# Patient Record
Sex: Female | Born: 1965 | Race: White | Hispanic: No | Marital: Single | Smoking: Never smoker
Health system: Southern US, Community
[De-identification: ages and names within clinical notes are randomized; demographics above are authoritative.]

## PROBLEM LIST (undated history)

## (undated) DIAGNOSIS — F419 Anxiety disorder, unspecified: Secondary | ICD-10-CM

---

## 1997-08-17 ENCOUNTER — Other Ambulatory Visit: Admission: RE | Admit: 1997-08-17 | Discharge: 1997-08-17 | Payer: Self-pay | Admitting: Dermatology

## 1998-03-22 ENCOUNTER — Other Ambulatory Visit: Admission: RE | Admit: 1998-03-22 | Discharge: 1998-03-22 | Payer: Self-pay | Admitting: Obstetrics and Gynecology

## 1998-07-19 ENCOUNTER — Inpatient Hospital Stay (HOSPITAL_COMMUNITY): Admission: AD | Admit: 1998-07-19 | Discharge: 1998-07-19 | Payer: Self-pay | Admitting: Obstetrics and Gynecology

## 1998-08-21 ENCOUNTER — Inpatient Hospital Stay (HOSPITAL_COMMUNITY): Admission: AD | Admit: 1998-08-21 | Discharge: 1998-08-25 | Payer: Self-pay | Admitting: Obstetrics and Gynecology

## 1998-08-22 ENCOUNTER — Encounter: Payer: Self-pay | Admitting: Obstetrics and Gynecology

## 1998-10-09 ENCOUNTER — Other Ambulatory Visit: Admission: RE | Admit: 1998-10-09 | Discharge: 1998-10-09 | Payer: Self-pay | Admitting: Gynecology

## 1999-06-27 ENCOUNTER — Other Ambulatory Visit: Admission: RE | Admit: 1999-06-27 | Discharge: 1999-06-27 | Payer: Self-pay | Admitting: Gynecology

## 2003-03-12 ENCOUNTER — Emergency Department (HOSPITAL_COMMUNITY): Admission: EM | Admit: 2003-03-12 | Discharge: 2003-03-12 | Payer: Self-pay | Admitting: Emergency Medicine

## 2015-11-18 ENCOUNTER — Encounter (HOSPITAL_COMMUNITY): Payer: Self-pay | Admitting: Emergency Medicine

## 2015-11-18 ENCOUNTER — Ambulatory Visit (HOSPITAL_COMMUNITY)
Admission: EM | Admit: 2015-11-18 | Discharge: 2015-11-18 | Disposition: A | Discharge status: Home / Self Care | Payer: Managed Care, Other (non HMO) | Attending: Internal Medicine | Admitting: Internal Medicine

## 2015-11-18 DIAGNOSIS — J069 Acute upper respiratory infection, unspecified: Secondary | ICD-10-CM

## 2015-11-18 DIAGNOSIS — H6593 Unspecified nonsuppurative otitis media, bilateral: Secondary | ICD-10-CM | POA: Diagnosis not present

## 2015-11-18 DIAGNOSIS — R0982 Postnasal drip: Secondary | ICD-10-CM

## 2015-11-18 MED ORDER — PHENYLEPHRINE-CHLORPHEN-DM 10-4-12.5 MG/5ML PO LIQD
5.0000 mL | ORAL | 0 refills | Status: DC | PRN
Start: 2015-11-18 — End: 2018-06-03

## 2015-11-18 MED ORDER — AZITHROMYCIN 250 MG PO TABS: ORAL_TABLET | ORAL | 0 refills | Status: DC

## 2015-11-18 NOTE — ED Provider Notes (Signed)
CSN: [id number]     Arrival date & time 11/18/15  1551 History   First MD Initiated Contact with Patient 11/18/15 1748     Chief Complaint  Patient presents with  . URI   (Consider location/radiation/quality/duration/timing/severity/associated sxs/prior Treatment) 50-year-old female states she has had a cold since September 19. Symptoms include cough, PND, nasal drainage, sore throat and ears feeling like they are full of fluid. States her temperature home was 99.9.      History reviewed. No pertinent past medical history. History reviewed. No pertinent surgical history. History reviewed. No pertinent family history. Social History  Substance Use Topics  . Smoking status: Never Smoker  . Smokeless tobacco: Never Used  . Alcohol use No   OB History    No data available     Review of Systems  Constitutional: Positive for activity change and fever. Negative for appetite change, chills and fatigue.  HENT: Positive for congestion, postnasal drip, rhinorrhea and sinus pressure. Negative for facial swelling.   Eyes: Negative.   Respiratory: Positive for cough. Negative for shortness of breath.   Cardiovascular: Negative.   Gastrointestinal: Negative.   Musculoskeletal: Negative for neck pain and neck stiffness.  Skin: Negative for pallor and rash.  Neurological: Negative.     Allergies  Penicillins  Home Medications   Prior to Admission medications   Medication Sig Start Date End Date Taking? Authorizing Provider  azithromycin (ZITHROMAX) 250 MG tablet Take 2 tabs on day 1, then one tablet po once daily on days 2-5. 11/18/15   Hayden Rasmussenavid Bulah Lurie, NP  Phenylephrine-Chlorphen-DM 11-29-10.5 MG/5ML LIQD Take 5 mLs by mouth every 4 (four) hours as needed. 11/18/15   Hayden Rasmussenavid Arayla Kruschke, NP   Meds Ordered and Administered this Visit  Medications - No data to display  BP 155/92 (BP Location: Left Arm)   Pulse 84   Temp 99.1 F (37.3 C) (Oral)   Resp 16   LMP 10/31/2015   SpO2 99%  No data  found.   Physical Exam  Constitutional: She is oriented to person, place, and time. She appears well-developed and well-nourished. No distress.  HENT:  Head: Normocephalic and atraumatic.  Mouth/Throat: No oropharyngeal exudate.  Bilateral TMs are erythematous and bulging.  Oropharynx with minor erythema and cobblestoning. No swelling or exudates. Airway widely patent  Eyes: EOM are normal.  Neck: Normal range of motion. Neck supple.  Cardiovascular: Normal rate, regular rhythm and normal heart sounds.   Pulmonary/Chest: Effort normal and breath sounds normal. No respiratory distress. She has no wheezes. She has no rales.  Musculoskeletal: Normal range of motion. She exhibits no edema.  Lymphadenopathy:    She has no cervical adenopathy.  Neurological: She is alert and oriented to person, place, and time.  Skin: Skin is warm and dry. No rash noted.  Psychiatric: She has a normal mood and affect.  Nursing note and vitals reviewed.   Urgent Care Course   Clinical Course    Procedures (including critical care time)  Labs Review Labs Reviewed - No data to display  Imaging Review No results found.   Visual Acuity Review  Right Eye Distance:   Left Eye Distance:   Bilateral Distance:    Right Eye Near:   Left Eye Near:    Bilateral Near:         MDM   1. URI (upper respiratory infection)   2. Otitis media with effusion, bilateral   3. PND (post-nasal drip)    Drink plenty of fluids and  stay well-hydrated. May take ibuprofen 600 mg every 6-8 hours as needed for discomfort. Medications prescribed may cause drowsiness. Meds ordered this encounter  Medications  . Phenylephrine-Chlorphen-DM 11-29-10.5 MG/5ML LIQD    Sig: Take 5 mLs by mouth every 4 (four) hours as needed.    Dispense:  120 mL    Refill:  0    Order Specific Question:   Supervising Provider    Answer:   Eustace Moore [[id number]]  . azithromycin (ZITHROMAX) 250 MG tablet    Sig: Take 2 tabs on day  1, then one tablet po once daily on days 2-5.    Dispense:  6 tablet    Refill:  0    Order Specific Question:   Supervising Provider    Answer:   Eustace Moore [[id number]]       Hayden Rasmussen, NP 11/18/15 1814    Hayden Rasmussen, NP 11/18/15 1815

## 2015-11-18 NOTE — Discharge Instructions (Signed)
Drink plenty of fluids and stay well-hydrated. May take ibuprofen 600 mg every 6-8 hours as needed for discomfort. Medications prescribed may cause drowsiness.

## 2015-11-18 NOTE — ED Triage Notes (Signed)
Pt is here for cold sx onset 09/19 associated w/left ear pain, cough, fevers, PND, nasal congestion/drainage  A&O x4... NAD

## 2016-10-14 ENCOUNTER — Ambulatory Visit: Payer: Managed Care, Other (non HMO) | Admitting: Family Medicine

## 2016-10-22 ENCOUNTER — Ambulatory Visit: Payer: Managed Care, Other (non HMO) | Admitting: Family Medicine

## 2017-10-21 ENCOUNTER — Encounter (HOSPITAL_COMMUNITY): Payer: Self-pay

## 2017-10-21 ENCOUNTER — Ambulatory Visit (HOSPITAL_COMMUNITY)
Admission: EM | Admit: 2017-10-21 | Discharge: 2017-10-21 | Disposition: A | Discharge status: Home / Self Care | Payer: BLUE CROSS/BLUE SHIELD | Attending: Family Medicine | Admitting: Family Medicine

## 2017-10-21 DIAGNOSIS — G4700 Insomnia, unspecified: Secondary | ICD-10-CM

## 2017-10-21 DIAGNOSIS — F418 Other specified anxiety disorders: Secondary | ICD-10-CM

## 2017-10-21 MED ORDER — LORAZEPAM 1 MG PO TABS: 1.0000 mg | ORAL_TABLET | Freq: Three times a day (TID) | ORAL | 0 refills | Status: DC | PRN

## 2017-10-21 NOTE — ED Triage Notes (Signed)
Pt complains of having anxiety and not being able to sleep the past few days because of family stress.

## 2017-10-21 NOTE — ED Provider Notes (Signed)
[facility]   [id number] 10/21/17 Arrival Time: 1512  ASSESSMENT & PLAN:  1. Situational anxiety   2. Insomnia, unspecified type     Meds ordered this encounter  Medications  . LORazepam (ATIVAN) 1 MG tablet    Sig: Take 1 tablet (1 mg total) by mouth every 8 (eight) hours as needed for anxiety.    Dispense:  20 tablet    Refill:  0   Med sedation precautions. Temporary, as needed, use explained. To schedule f/u with PCP asap.  Reviewed expectations re: course of current medical issues. Questions answered. Outlined signs and symptoms indicating need for more acute intervention. Patient verbalized understanding. After Visit Summary given.   SUBJECTIVE:  Debra Russell is a 52 y.o. female who presents with complaint of:  Anxiety: Patient complains of sleep disturbance and and generalized anxiety/stress. She has the following symptoms: feelings of losing control, insomnia, racing thoughts. Onset of symptoms was approximately a few days ago, unchanged since that time. She denies current suicidal and homicidal ideation. Family history significant for no psychiatric illness.Possible organic causes contributing are: none. Risk factors: previous anxiety Previous treatment includes Xanax. No specific aggravating or alleviating factors reported. OTC Melatonin without help. No drug use reported. Able to function at work "but tired."  ROS: As per HPI.   OBJECTIVE:  Vitals:   10/21/17 1551  BP: (!) 150/99  Pulse: 74  Resp: 20  Temp: 97.7 F (36.5 C)  TempSrc: Oral  SpO2: 97%    General appearance: alert; no distress Eyes: PERRLA; EOMI; conjunctiva normal HENT: normocephalic; atraumatic Neck: supple Lungs: clear to auscultation bilaterally Heart: regular rate and rhythm Ab Extremities: edema; symmetrical with no gross deformities Skin: warm and dry Neurologic: normal gait Psychological: alert and cooperative; normal mood and affect   Allergies    Allergen Reactions  . Penicillins      Social History   Socioeconomic History  . Marital status: Single    Spouse name: Not on file  . Number of children: Not on file  . Years of education: Not on file  . Highest education level: Not on file  Occupational History  . Not on file  Social Needs  . Financial resource strain: Not on file  . Food insecurity:    Worry: Not on file    Inability: Not on file  . Transportation needs:    Medical: Not on file    Non-medical: Not on file  Tobacco Use  . Smoking status: Never Smoker  . Smokeless tobacco: Never Used  Substance and Sexual Activity  . Alcohol use: No  . Drug use: Not on file  . Sexual activity: Not on file  Lifestyle  . Physical activity:    Days per week: Not on file    Minutes per session: Not on file  . Stress: Not on file  Relationships  . Social connections:    Talks on phone: Not on file    Gets together: Not on file    Attends religious service: Not on file    Active member of club or organization: Not on file    Attends meetings of clubs or organizations: Not on file    Relationship status: Not on file  . Intimate partner violence:    Fear of current or ex partner: Not on file    Emotionally abused: Not on file    Physically abused: Not on file    Forced sexual activity: Not on file  Other Topics Concern  .  Not on file  Social History Narrative  . Not on file   History reviewed. No pertinent family history. History reviewed. No pertinent surgical history.    Mardella LaymanHagler, Judene Logue, MD 10/25/17 [phone]

## 2017-10-21 NOTE — Discharge Instructions (Signed)
Be aware, anxiety medications may cause drowsiness. Please do not drive, operate heavy machinery or make important decisions while on this medication, it can cloud your judgement.

## 2018-06-01 ENCOUNTER — Emergency Department (HOSPITAL_COMMUNITY)
Admission: EM | Admit: 2018-06-01 | Discharge: 2018-06-02 | Disposition: A | Discharge status: Home / Self Care | Payer: BLUE CROSS/BLUE SHIELD | Attending: Emergency Medicine | Admitting: Emergency Medicine

## 2018-06-01 ENCOUNTER — Other Ambulatory Visit: Payer: Self-pay

## 2018-06-01 ENCOUNTER — Emergency Department (HOSPITAL_COMMUNITY): Payer: BLUE CROSS/BLUE SHIELD

## 2018-06-01 DIAGNOSIS — Y998 Other external cause status: Secondary | ICD-10-CM | POA: Diagnosis not present

## 2018-06-01 DIAGNOSIS — S82842A Displaced bimalleolar fracture of left lower leg, initial encounter for closed fracture: Secondary | ICD-10-CM

## 2018-06-01 DIAGNOSIS — S99912A Unspecified injury of left ankle, initial encounter: Secondary | ICD-10-CM | POA: Diagnosis present

## 2018-06-01 DIAGNOSIS — S82812A Torus fracture of upper end of left fibula, initial encounter for closed fracture: Secondary | ICD-10-CM

## 2018-06-01 DIAGNOSIS — Y92410 Unspecified street and highway as the place of occurrence of the external cause: Secondary | ICD-10-CM | POA: Diagnosis not present

## 2018-06-01 DIAGNOSIS — Y9389 Activity, other specified: Secondary | ICD-10-CM | POA: Insufficient documentation

## 2018-06-01 MED ORDER — METHOCARBAMOL 500 MG PO TABS: 500.0000 mg | ORAL_TABLET | Freq: Two times a day (BID) | ORAL | 0 refills | Status: DC

## 2018-06-01 MED ORDER — OXYCODONE-ACETAMINOPHEN 5-325 MG PO TABS: 1.0000 | ORAL_TABLET | ORAL | 0 refills | Status: DC | PRN
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
## 2018-06-01 NOTE — ED Notes (Signed)
Daughter of Pt called to come pick up pt.

## 2018-06-03 NOTE — Patient Instructions (Signed)
Debra Russell  06/03/2018   Your procedure is scheduled on: 06/09/2018   [facility]  Report to admitting at   0530 AM    Call this number if you have problems the morning of surgery [phone]   Remember: Do not eat food  :After Midnight. BRUSH YOUR TEETH MORNING OF SURGERY AND RINSE YOUR MOUTH OUT, NO CHEWING GUM CANDY OR MINTS.  NO SOLID FOOD AFTER MIDNIGHT THE NIGHT PRIOR TO SURGERY. NOTHING BY MOUTH EXCEPT CLEAR LIQUIDS UNTIL 3 HOURS PRIOR TO SCHEULED SURGERY. PLEASE FINISH ENSURE DRINK PER SURGEON ORDER 3 HOURS PRIOR TO SCHEDULED SURGERY TIME WHICH NEEDS TO BE COMPLETED AT ___0430am_________.    CLEAR LIQUID DIET   Foods Allowed                                                                     Foods Excluded  Coffee and tea, regular and decaf                             liquids that you cannot  Plain Jell-O in any flavor                                             see through such as: Fruit ices (not with fruit pulp)                                     milk, soups, orange juice  Iced Popsicles                                    All solid food Carbonated beverages, regular and diet                                    Cranberry, grape and apple juices Sports drinks like Gatorade Lightly seasoned clear broth or consume(fat free) Sugar, honey syrup  Sample Menu Breakfast                                Lunch                                     Supper Cranberry juice                    Beef broth                            Chicken broth Jell-O  Grape juice                           Apple juice Coffee or tea                        Jell-O                                      Popsicle                                                Coffee or tea                        Coffee or tea  _____________________________________________________________________    Take these medicines the morning of  surgery with A SIP OF WATER: Percocet if needed                                 You may not have any metal on your body including hair pins and              piercings  Do not wear jewelry, make-up, lotions, powders or perfumes, deodorant             Do not wear nail polish.  Do not shave  48 hours prior to surgery.              Men may shave face and neck.   Do not bring valuables to the hospital. [facility] IS NOT             RESPONSIBLE   FOR VALUABLES.  Contacts, dentures or bridgework may not be worn into surgery.  Leave suitcase in the car. After surgery it may be brought to your room.    Name and phone number of your driver:                Please read over the following fact sheets you were given: _____________________________________________________________________             [facility] - Preparing for Surgery Before surgery, you can play an important role.  Because skin is not sterile, your skin needs to be as free of germs as possible.  You can reduce the number of germs on your skin by washing with CHG (chlorahexidine gluconate) soap before surgery.  CHG is an antiseptic cleaner which kills germs and bonds with the skin to continue killing germs even after washing. Please DO NOT use if you have an allergy to CHG or antibacterial soaps.  If your skin becomes reddened/irritated stop using the CHG and inform your nurse when you arrive at Short Stay. Do not shave (including legs and underarms) for at least 48 hours prior to the first CHG shower.  You may shave your face/neck. Please follow these instructions carefully:  1.  Shower with CHG Soap the night before surgery and the  morning of Surgery.  2.  If you choose to wash your hair, wash your hair first as usual with your  normal  shampoo.  3.  After you shampoo, rinse your hair and body thoroughly to remove  the  shampoo.                           4.  Use CHG as you would any other liquid soap.  You can apply chg directly  to  the skin and wash                       Gently with a scrungie or clean washcloth.  5.  Apply the CHG Soap to your body ONLY FROM THE NECK DOWN.   Do not use on face/ open                           Wound or open sores. Avoid contact with eyes, ears mouth and genitals (private parts).                       Wash face,  Genitals (private parts) with your normal soap.             6.  Wash thoroughly, paying special attention to the area where your surgery  will be performed.  7.  Thoroughly rinse your body with warm water from the neck down.  8.  DO NOT shower/wash with your normal soap after using and rinsing off  the CHG Soap.                9.  Pat yourself dry with a clean towel.            10.  Wear clean pajamas.            11.  Place clean sheets on your bed the night of your first shower and do not  sleep with pets. Day of Surgery : Do not apply any lotions/deodorants the morning of surgery.  Please wear clean clothes to the hospital/surgery center.  FAILURE TO FOLLOW THESE INSTRUCTIONS MAY RESULT IN THE CANCELLATION OF YOUR SURGERY PATIENT SIGNATURE_________________________________  NURSE SIGNATURE__________________________________  ________________________________________________________________________  WHAT IS A BLOOD TRANSFUSION? Blood Transfusion Information  A transfusion is the replacement of blood or some of its parts. Blood is made up of multiple cells which provide different functions.  Red blood cells carry oxygen and are used for blood loss replacement.  White blood cells fight against infection.  Platelets control bleeding.  Plasma helps clot blood.  Other blood products are available for specialized needs, such as hemophilia or other clotting disorders. BEFORE THE TRANSFUSION  Who gives blood for transfusions?   Healthy volunteers who are fully evaluated to make sure their blood is safe. This is blood bank blood. Transfusion therapy is the safest it has ever  been in the practice of medicine. Before blood is taken from a donor, a complete history is taken to make sure that person has no history of diseases nor engages in risky social behavior (examples are intravenous drug use or sexual activity with multiple partners). The donor's travel history is screened to minimize risk of transmitting infections, such as malaria. The donated blood is tested for signs of infectious diseases, such as HIV and hepatitis. The blood is then tested to be sure it is compatible with you in order to minimize the chance of a transfusion reaction. If you or a relative donates blood, this is often done in anticipation of surgery and is not appropriate for emergency situations. It takes many days to process the donated blood. RISKS  AND COMPLICATIONS Although transfusion therapy is very safe and saves many lives, the main dangers of transfusion include:   Getting an infectious disease.  Developing a transfusion reaction. This is an allergic reaction to something in the blood you were given. Every precaution is taken to prevent this. The decision to have a blood transfusion has been considered carefully by your caregiver before blood is given. Blood is not given unless the benefits outweigh the risks. AFTER THE TRANSFUSION  Right after receiving a blood transfusion, you will usually feel much better and more energetic. This is especially true if your red blood cells have gotten low (anemic). The transfusion raises the level of the red blood cells which carry oxygen, and this usually causes an energy increase.  The nurse administering the transfusion will monitor you carefully for complications. HOME CARE INSTRUCTIONS  No special instructions are needed after a transfusion. You may find your energy is better. Speak with your caregiver about any limitations on activity for underlying diseases you may have. SEEK MEDICAL CARE IF:   Your condition is not improving after your  transfusion.  You develop redness or irritation at the intravenous (IV) site. SEEK IMMEDIATE MEDICAL CARE IF:  Any of the following symptoms occur over the next 12 hours:  Shaking chills.  You have a temperature by mouth above 102 F (38.9 C), not controlled by medicine.  Chest, back, or muscle pain.  People around you feel you are not acting correctly or are confused.  Shortness of breath or difficulty breathing.  Dizziness and fainting.  You get a rash or develop hives.  You have a decrease in urine output.  Your urine turns a dark color or changes to pink, red, or brown. Any of the following symptoms occur over the next 10 days:  You have a temperature by mouth above 102 F (38.9 C), not controlled by medicine.  Shortness of breath.  Weakness after normal activity.  The white part of the eye turns yellow (jaundice).  You have a decrease in the amount of urine or are urinating less often.  Your urine turns a dark color or changes to pink, red, or brown. Document Released: 02/09/2000 Document Revised: 05/06/2011 Document Reviewed: 09/28/2007 Exit[employer] Patient Information 2014 [place], [place].  _______________________________________________________________________  Incentive Spirometer  An incentive spirometer is a tool that can help keep your lungs clear and active. This tool measures how well you are filling your lungs with each breath. Taking long deep breaths may help reverse or decrease the chance of developing breathing (pulmonary) problems (especially infection) following:  A long period of time when you are unable to move or be active. BEFORE THE PROCEDURE   If the spirometer includes an indicator to show your best effort, your nurse or respiratory therapist will set it to a desired goal.  If possible, sit up straight or lean slightly forward. Try not to slouch.  Hold the incentive spirometer in an upright position. INSTRUCTIONS FOR USE  1. Sit on the  edge of your bed if possible, or sit up as far as you can in bed or on a chair. 2. Hold the incentive spirometer in an upright position. 3. Breathe out normally. 4. Place the mouthpiece in your mouth and seal your lips tightly around it. 5. Breathe in slowly and as deeply as possible, raising the piston or the ball toward the top of the column. 6. Hold your breath for 3-5 seconds or for as long as possible. Allow the piston or  ball to fall to the bottom of the column. 7. Remove the mouthpiece from your mouth and breathe out normally. 8. Rest for a few seconds and repeat Steps 1 through 7 at least 10 times every 1-2 hours when you are awake. Take your time and take a few normal breaths between deep breaths. 9. The spirometer may include an indicator to show your best effort. Use the indicator as a goal to work toward during each repetition. 10. After each set of 10 deep breaths, practice coughing to be sure your lungs are clear. If you have an incision (the cut made at the time of surgery), support your incision when coughing by placing a pillow or rolled up towels firmly against it. Once you are able to get out of bed, walk around indoors and cough well. You may stop using the incentive spirometer when instructed by your caregiver.  RISKS AND COMPLICATIONS  Take your time so you do not get dizzy or light-headed.  If you are in pain, you may need to take or ask for pain medication before doing incentive spirometry. It is harder to take a deep breath if you are having pain. AFTER USE  Rest and breathe slowly and easily.  It can be helpful to keep track of a log of your progress. Your caregiver can provide you with a simple table to help with this. If you are using the spirometer at home, follow these instructions: SEEK MEDICAL CARE IF:   You are having difficultly using the spirometer.  You have trouble using the spirometer as often as instructed.  Your pain medication is not giving enough  relief while using the spirometer.  You develop fever of 100.5 F (38.1 C) or higher. SEEK IMMEDIATE MEDICAL CARE IF:   You cough up bloody sputum that had not been present before.  You develop fever of 102 F (38.9 C) or greater.  You develop worsening pain at or near the incision site. MAKE SURE YOU:   Understand these instructions.  Will watch your condition.  Will get help right away if you are not doing well or get worse. Document Released: 06/24/2006 Document Revised: 05/06/2011 Document Reviewed: 08/25/2006 [facility] Patient Information 2014 [place], [place].   ________________________________________________________________________

## 2018-06-04 ENCOUNTER — Encounter (HOSPITAL_COMMUNITY): Payer: Self-pay

## 2018-06-04 ENCOUNTER — Other Ambulatory Visit: Payer: Self-pay

## 2018-06-04 ENCOUNTER — Encounter (HOSPITAL_COMMUNITY)
Admission: RE | Admit: 2018-06-04 | Discharge: 2018-06-04 | Disposition: A | Discharge status: Home / Self Care | Payer: BLUE CROSS/BLUE SHIELD | Source: Ambulatory Visit | Attending: Orthopedic Surgery | Admitting: Orthopedic Surgery

## 2018-06-04 DIAGNOSIS — Z01812 Encounter for preprocedural laboratory examination: Secondary | ICD-10-CM | POA: Insufficient documentation

## 2018-06-04 HISTORY — DX: Anxiety disorder, unspecified: F41.9

## 2018-06-04 LAB — CBC
HCT: 40.1 % (ref 36.0–46.0)
Hemoglobin: 12.8 g/dL (ref 12.0–15.0)
MCH: 29.9 pg (ref 26.0–34.0)
MCHC: 31.9 g/dL (ref 30.0–36.0)
MCV: 93.7 fL (ref 80.0–100.0)
Platelets: 321 10*3/uL (ref 150–400)
RBC: 4.28 MIL/uL (ref 3.87–5.11)
RDW: 12.7 % (ref 11.5–15.5)
WBC: 9.1 10*3/uL (ref 4.0–10.5)
nRBC: 0 % (ref 0.0–0.2)

## 2018-06-04 LAB — ABO/RH: ABO/RH(D): A POS

## 2018-06-04 LAB — HCG, SERUM, QUALITATIVE: Preg, Serum: NEGATIVE

## 2018-06-04 NOTE — Progress Notes (Signed)
SPOKE W/  _patient      SCREENING SYMPTOMS OF COVID 19:   COUGH--no  RUNNY NOSE--- no  SORE THROAT---no  NASAL CONGESTION----no  SNEEZING----no  SHORTNESS OF BREATH---no  DIFFICULTY BREATHING---no  TEMP >100.4-----no  UNEXPLAINED BODY ACHES------no   HAVE YOU OR ANY FAMILY MEMBER TRAVELLED PAST 14 DAYS OUT OF THE   COUNTY--no- STATE----no COUNTRY----no  HAVE YOU OR ANY FAMILY MEMBER BEEN EXPOSED TO ANYONE WITH COVID 19?  no    
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
## 2018-06-09 ENCOUNTER — Observation Stay (HOSPITAL_COMMUNITY)
Admission: RE | Admit: 2018-06-09 | Discharge: 2018-06-10 | Disposition: A | Discharge status: Home / Self Care | Payer: BLUE CROSS/BLUE SHIELD | Attending: Orthopedic Surgery | Admitting: Orthopedic Surgery

## 2018-06-09 ENCOUNTER — Encounter (HOSPITAL_COMMUNITY): Payer: Self-pay | Admitting: *Deleted

## 2018-06-09 ENCOUNTER — Other Ambulatory Visit: Payer: Self-pay

## 2018-06-09 ENCOUNTER — Encounter (HOSPITAL_COMMUNITY)
Admission: RE | Disposition: A | Discharge status: Home / Self Care | Payer: Self-pay | Source: Home / Self Care | Attending: Orthopedic Surgery

## 2018-06-09 ENCOUNTER — Ambulatory Visit (HOSPITAL_COMMUNITY): Payer: BLUE CROSS/BLUE SHIELD | Admitting: Physician Assistant

## 2018-06-09 ENCOUNTER — Ambulatory Visit (HOSPITAL_COMMUNITY): Payer: BLUE CROSS/BLUE SHIELD | Admitting: Registered Nurse

## 2018-06-09 ENCOUNTER — Ambulatory Visit (HOSPITAL_COMMUNITY): Payer: BLUE CROSS/BLUE SHIELD

## 2018-06-09 DIAGNOSIS — F419 Anxiety disorder, unspecified: Secondary | ICD-10-CM | POA: Diagnosis not present

## 2018-06-09 DIAGNOSIS — S82842A Displaced bimalleolar fracture of left lower leg, initial encounter for closed fracture: Secondary | ICD-10-CM | POA: Diagnosis not present

## 2018-06-09 DIAGNOSIS — Z419 Encounter for procedure for purposes other than remedying health state, unspecified: Secondary | ICD-10-CM

## 2018-06-09 DIAGNOSIS — S82843A Displaced bimalleolar fracture of unspecified lower leg, initial encounter for closed fracture: Secondary | ICD-10-CM | POA: Diagnosis present

## 2018-06-09 HISTORY — PX: ORIF ANKLE FRACTURE: SHX5408

## 2018-06-09 LAB — TYPE AND SCREEN
ABO/RH(D): A POS
Antibody Screen: NEGATIVE

## 2018-06-09 LAB — SURGICAL PCR SCREEN
MRSA, PCR: NEGATIVE
Staphylococcus aureus: NEGATIVE

## 2018-06-09 SURGERY — OPEN REDUCTION INTERNAL FIXATION (ORIF) ANKLE FRACTURE
Anesthesia: Regional | Laterality: Left

## 2018-06-09 MED ORDER — FENTANYL CITRATE (PF) 100 MCG/2ML IJ SOLN
INTRAMUSCULAR | Status: DC | PRN
  Administered 2018-06-09 (×4): 50 ug via INTRAVENOUS

## 2018-06-09 MED ORDER — OXYCODONE HCL 5 MG PO TABS: 10.0000 mg | ORAL_TABLET | ORAL | Status: DC | PRN

## 2018-06-09 MED ORDER — SUCCINYLCHOLINE CHLORIDE 200 MG/10ML IV SOSY
PREFILLED_SYRINGE | INTRAVENOUS | Status: DC | PRN
  Administered 2018-06-09: 100 mg via INTRAVENOUS

## 2018-06-09 MED ORDER — OXYCODONE HCL 5 MG PO TABS: 5.0000 mg | ORAL_TABLET | Freq: Once | ORAL | Status: DC | PRN

## 2018-06-09 MED ORDER — ROPIVACAINE HCL 7.5 MG/ML IJ SOLN
INTRAMUSCULAR | Status: DC | PRN
  Administered 2018-06-09: 15 mL via PERINEURAL

## 2018-06-09 MED ORDER — ESMOLOL HCL 100 MG/10ML IV SOLN
INTRAVENOUS | Status: AC
  Filled 2018-06-09: qty 10

## 2018-06-09 MED ORDER — ALUM & MAG HYDROXIDE-SIMETH 200-200-20 MG/5ML PO SUSP: 15.0000 mL | ORAL | Status: DC | PRN

## 2018-06-09 MED ORDER — MIDAZOLAM HCL 2 MG/2ML IJ SOLN
INTRAMUSCULAR | Status: AC
  Filled 2018-06-09: qty 2

## 2018-06-09 MED ORDER — OXYCODONE HCL 5 MG PO TABS: 5.0000 mg | ORAL_TABLET | ORAL | Status: DC | PRN

## 2018-06-09 MED ORDER — 0.9 % SODIUM CHLORIDE (POUR BTL) OPTIME
TOPICAL | Status: DC | PRN
  Administered 2018-06-09: 08:00:00 1000 mL

## 2018-06-09 MED ORDER — METHOCARBAMOL 500 MG IVPB - SIMPLE MED
500.0000 mg | Freq: Four times a day (QID) | INTRAVENOUS | Status: DC | PRN
  Filled 2018-06-09: qty 50

## 2018-06-09 MED ORDER — VANCOMYCIN HCL 1000 MG IV SOLR
1000.0000 mg | Freq: Two times a day (BID) | INTRAVENOUS | Status: AC
  Administered 2018-06-09: 1000 mg via INTRAVENOUS
  Filled 2018-06-09: qty 1000

## 2018-06-09 MED ORDER — DEXAMETHASONE SODIUM PHOSPHATE 10 MG/ML IJ SOLN
10.0000 mg | Freq: Once | INTRAMUSCULAR | Status: AC
  Administered 2018-06-09: 10 mg via INTRAVENOUS

## 2018-06-09 MED ORDER — MENTHOL 3 MG MT LOZG: 1.0000 | LOZENGE | OROMUCOSAL | Status: DC | PRN

## 2018-06-09 MED ORDER — TRANEXAMIC ACID-NACL 1000-0.7 MG/100ML-% IV SOLN
INTRAVENOUS | Status: AC
  Filled 2018-06-09: qty 100

## 2018-06-09 MED ORDER — MIDAZOLAM HCL 5 MG/5ML IJ SOLN
INTRAMUSCULAR | Status: DC | PRN
  Administered 2018-06-09: 2 mg via INTRAVENOUS

## 2018-06-09 MED ORDER — ONDANSETRON HCL 4 MG/2ML IJ SOLN
INTRAMUSCULAR | Status: AC
  Filled 2018-06-09: qty 2

## 2018-06-09 MED ORDER — ASPIRIN 81 MG PO CHEW
81.0000 mg | CHEWABLE_TABLET | Freq: Two times a day (BID) | ORAL | Status: DC
  Administered 2018-06-09 – 2018-06-10 (×2): 81 mg via ORAL
  Filled 2018-06-09 (×2): qty 1

## 2018-06-09 MED ORDER — ONDANSETRON HCL 4 MG PO TABS: 4.0000 mg | ORAL_TABLET | Freq: Four times a day (QID) | ORAL | Status: DC | PRN

## 2018-06-09 MED ORDER — MUPIROCIN 2 % EX OINT
1.0000 "application " | TOPICAL_OINTMENT | Freq: Two times a day (BID) | CUTANEOUS | Status: DC
  Administered 2018-06-09 – 2018-06-10 (×3): 1 via NASAL
  Filled 2018-06-09: qty 22

## 2018-06-09 MED ORDER — BISACODYL 10 MG RE SUPP: 10.0000 mg | Freq: Every day | RECTAL | Status: DC | PRN

## 2018-06-09 MED ORDER — ROCURONIUM BROMIDE 10 MG/ML (PF) SYRINGE
PREFILLED_SYRINGE | INTRAVENOUS | Status: DC | PRN
  Administered 2018-06-09: 10 mg via INTRAVENOUS
  Administered 2018-06-09: 40 mg via INTRAVENOUS
  Administered 2018-06-09: 20 mg via INTRAVENOUS

## 2018-06-09 MED ORDER — ONDANSETRON HCL 4 MG/2ML IJ SOLN: 4.0000 mg | Freq: Four times a day (QID) | INTRAMUSCULAR | Status: DC | PRN

## 2018-06-09 MED ORDER — ACETAMINOPHEN 500 MG PO TABS
1000.0000 mg | ORAL_TABLET | Freq: Four times a day (QID) | ORAL | Status: AC
  Administered 2018-06-09 – 2018-06-10 (×2): 1000 mg via ORAL
  Filled 2018-06-09 (×4): qty 2

## 2018-06-09 MED ORDER — ACETAMINOPHEN 160 MG/5ML PO SOLN: 1000.0000 mg | Freq: Once | ORAL | Status: DC | PRN

## 2018-06-09 MED ORDER — CEFAZOLIN SODIUM-DEXTROSE 2-4 GM/100ML-% IV SOLN
2.0000 g | INTRAVENOUS | Status: DC
  Administered 2018-06-09: 08:00:00 2 g via INTRAVENOUS

## 2018-06-09 MED ORDER — PHENOL 1.4 % MT LIQD: 1.0000 | OROMUCOSAL | Status: DC | PRN

## 2018-06-09 MED ORDER — DOCUSATE SODIUM 100 MG PO CAPS
100.0000 mg | ORAL_CAPSULE | Freq: Two times a day (BID) | ORAL | Status: DC
  Administered 2018-06-09 – 2018-06-10 (×2): 100 mg via ORAL
  Filled 2018-06-09 (×3): qty 1

## 2018-06-09 MED ORDER — ONDANSETRON HCL 4 MG/2ML IJ SOLN
INTRAMUSCULAR | Status: DC | PRN
  Administered 2018-06-09: 4 mg via INTRAVENOUS

## 2018-06-09 MED ORDER — MAGNESIUM CITRATE PO SOLN: 1.0000 | Freq: Once | ORAL | Status: DC | PRN

## 2018-06-09 MED ORDER — SUGAMMADEX SODIUM 200 MG/2ML IV SOLN
INTRAVENOUS | Status: DC | PRN
  Administered 2018-06-09: 200 mg via INTRAVENOUS

## 2018-06-09 MED ORDER — ACETAMINOPHEN 10 MG/ML IV SOLN: 1000.0000 mg | Freq: Once | INTRAVENOUS | Status: DC | PRN

## 2018-06-09 MED ORDER — STERILE WATER FOR IRRIGATION IR SOLN
Status: DC | PRN
  Administered 2018-06-09: 1000 mL

## 2018-06-09 MED ORDER — METOCLOPRAMIDE HCL 5 MG PO TABS: 5.0000 mg | ORAL_TABLET | Freq: Three times a day (TID) | ORAL | Status: DC | PRN

## 2018-06-09 MED ORDER — FENTANYL CITRATE (PF) 100 MCG/2ML IJ SOLN: 25.0000 ug | INTRAMUSCULAR | Status: DC | PRN

## 2018-06-09 MED ORDER — CEFAZOLIN SODIUM-DEXTROSE 2-4 GM/100ML-% IV SOLN
INTRAVENOUS | Status: AC
  Filled 2018-06-09: qty 100

## 2018-06-09 MED ORDER — POLYETHYLENE GLYCOL 3350 17 G PO PACK
17.0000 g | PACK | Freq: Two times a day (BID) | ORAL | Status: DC
  Administered 2018-06-09: 17 g via ORAL
  Filled 2018-06-09 (×2): qty 1

## 2018-06-09 MED ORDER — FENTANYL CITRATE (PF) 100 MCG/2ML IJ SOLN
INTRAMUSCULAR | Status: AC
  Filled 2018-06-09: qty 2

## 2018-06-09 MED ORDER — TRANEXAMIC ACID-NACL 1000-0.7 MG/100ML-% IV SOLN
1000.0000 mg | INTRAVENOUS | Status: AC
  Administered 2018-06-09: 1000 mg via INTRAVENOUS
  Filled 2018-06-09: qty 100

## 2018-06-09 MED ORDER — TRANEXAMIC ACID-NACL 1000-0.7 MG/100ML-% IV SOLN
1000.0000 mg | Freq: Once | INTRAVENOUS | Status: AC
  Administered 2018-06-09: 1000 mg via INTRAVENOUS
  Filled 2018-06-09: qty 100

## 2018-06-09 MED ORDER — PROPOFOL 10 MG/ML IV BOLUS
INTRAVENOUS | Status: DC | PRN
  Administered 2018-06-09: 140 mg via INTRAVENOUS

## 2018-06-09 MED ORDER — DEXAMETHASONE SODIUM PHOSPHATE 10 MG/ML IJ SOLN
10.0000 mg | Freq: Once | INTRAMUSCULAR | Status: AC
  Administered 2018-06-10: 10 mg via INTRAVENOUS
  Filled 2018-06-09: qty 1

## 2018-06-09 MED ORDER — ACETAMINOPHEN 500 MG PO TABS: 1000.0000 mg | ORAL_TABLET | Freq: Once | ORAL | Status: DC | PRN

## 2018-06-09 MED ORDER — ESMOLOL HCL 100 MG/10ML IV SOLN
INTRAVENOUS | Status: DC | PRN
  Administered 2018-06-09: 20 mg via INTRAVENOUS
  Administered 2018-06-09: 30 mg via INTRAVENOUS
  Administered 2018-06-09: 50 mg via INTRAVENOUS

## 2018-06-09 MED ORDER — SUGAMMADEX SODIUM 200 MG/2ML IV SOLN
INTRAVENOUS | Status: AC
  Filled 2018-06-09: qty 2

## 2018-06-09 MED ORDER — LACTATED RINGERS IV SOLN
INTRAVENOUS | Status: DC
  Administered 2018-06-09: 07:00:00 via INTRAVENOUS

## 2018-06-09 MED ORDER — FERROUS SULFATE 325 (65 FE) MG PO TABS
325.0000 mg | ORAL_TABLET | Freq: Two times a day (BID) | ORAL | Status: DC
  Administered 2018-06-10: 325 mg via ORAL
  Filled 2018-06-09: qty 1

## 2018-06-09 MED ORDER — METOCLOPRAMIDE HCL 5 MG/ML IJ SOLN: 5.0000 mg | Freq: Three times a day (TID) | INTRAMUSCULAR | Status: DC | PRN

## 2018-06-09 MED ORDER — CHLORHEXIDINE GLUCONATE 4 % EX LIQD: 60.0000 mL | Freq: Once | CUTANEOUS | Status: DC

## 2018-06-09 MED ORDER — HYDROMORPHONE HCL 1 MG/ML IJ SOLN: 0.5000 mg | INTRAMUSCULAR | Status: DC | PRN

## 2018-06-09 MED ORDER — OXYCODONE HCL 5 MG/5ML PO SOLN: 5.0000 mg | Freq: Once | ORAL | Status: DC | PRN

## 2018-06-09 MED ORDER — LIDOCAINE 2% (20 MG/ML) 5 ML SYRINGE
INTRAMUSCULAR | Status: DC | PRN
  Administered 2018-06-09: 80 mg via INTRAVENOUS

## 2018-06-09 MED ORDER — PROPOFOL 10 MG/ML IV BOLUS
INTRAVENOUS | Status: AC
  Filled 2018-06-09: qty 20

## 2018-06-09 MED ORDER — METHOCARBAMOL 500 MG PO TABS: 500.0000 mg | ORAL_TABLET | Freq: Four times a day (QID) | ORAL | Status: DC | PRN

## 2018-06-09 MED ORDER — DIPHENHYDRAMINE HCL 12.5 MG/5ML PO ELIX: 12.5000 mg | ORAL_SOLUTION | ORAL | Status: DC | PRN

## 2018-06-09 MED ORDER — SODIUM CHLORIDE 0.9 % IV SOLN
INTRAVENOUS | Status: DC
  Administered 2018-06-09: 13:00:00 via INTRAVENOUS

## 2018-06-09 MED ORDER — BUPIVACAINE-EPINEPHRINE (PF) 0.5% -1:200000 IJ SOLN
INTRAMUSCULAR | Status: DC | PRN
  Administered 2018-06-09: 30 mL via PERINEURAL

## 2018-06-09 SURGICAL SUPPLY — 57 items
APL PRP STRL LF DISP 70% ISPRP (MISCELLANEOUS) ×1
BAG SPEC THK2 15X12 ZIP CLS (MISCELLANEOUS)
BAG ZIPLOCK 12X15 (MISCELLANEOUS) ×1 IMPLANT
BIT DRILL 2.5X2.75 QC CALB (BIT) ×4 IMPLANT
BLADE SAW SGTL 11.0X1.19X90.0M (BLADE) IMPLANT
CHLORAPREP W/TINT 26 (MISCELLANEOUS) ×3 IMPLANT
CLOSURE WOUND 1/2 X4 (GAUZE/BANDAGES/DRESSINGS)
COVER SURGICAL LIGHT HANDLE (MISCELLANEOUS) ×3 IMPLANT
COVER WAND RF STERILE (DRAPES) IMPLANT
CUFF TOURN SGL QUICK 34 (TOURNIQUET CUFF) ×3
CUFF TRNQT CYL 34X4.125X (TOURNIQUET CUFF) ×1 IMPLANT
DRAPE C-ARM 42X120 X-RAY (DRAPES) ×3 IMPLANT
DRAPE U-SHAPE 47X51 STRL (DRAPES) ×3 IMPLANT
DRSG ADAPTIC 3X8 NADH LF (GAUZE/BANDAGES/DRESSINGS) ×1 IMPLANT
DRSG PAD ABDOMINAL 8X10 ST (GAUZE/BANDAGES/DRESSINGS) ×3 IMPLANT
ELECT REM PT RETURN 15FT ADLT (MISCELLANEOUS) ×3 IMPLANT
FIXATION ZIPTIGHT ANKLE SNDSMS (Ankle) IMPLANT
GAUZE SPONGE 4X4 12PLY STRL (GAUZE/BANDAGES/DRESSINGS) ×4 IMPLANT
GAUZE XEROFORM 1X8 LF (GAUZE/BANDAGES/DRESSINGS) ×2 IMPLANT
GLOVE BIOGEL M 7.0 STRL (GLOVE) IMPLANT
GLOVE BIOGEL PI IND STRL 7.5 (GLOVE) ×1 IMPLANT
GLOVE BIOGEL PI IND STRL 8.5 (GLOVE) ×1 IMPLANT
GLOVE BIOGEL PI INDICATOR 7.5 (GLOVE) ×2
GLOVE BIOGEL PI INDICATOR 8.5 (GLOVE) ×2
GLOVE ECLIPSE 8.0 STRL XLNG CF (GLOVE) IMPLANT
GLOVE ORTHO TXT STRL SZ7.5 (GLOVE) ×6 IMPLANT
GLOVE SURG ORTHO 8.0 STRL STRW (GLOVE) ×3 IMPLANT
GOWN STRL REUS W/TWL LRG LVL3 (GOWN DISPOSABLE) ×3 IMPLANT
GOWN STRL REUS W/TWL XL LVL3 (GOWN DISPOSABLE) ×6 IMPLANT
KIT TURNOVER KIT A (KITS) ×2 IMPLANT
MANIFOLD NEPTUNE II (INSTRUMENTS) ×3 IMPLANT
NS IRRIG 1000ML POUR BTL (IV SOLUTION) ×3 IMPLANT
PACK TOTAL JOINT (CUSTOM PROCEDURE TRAY) ×3 IMPLANT
PAD CAST 4YDX4 CTTN HI CHSV (CAST SUPPLIES) ×2 IMPLANT
PADDING CAST COTTON 4X4 STRL (CAST SUPPLIES) ×3
PADDING CAST COTTON 6X4 STRL (CAST SUPPLIES) ×2 IMPLANT
PLATE ACE 100DEG 7HOLE (Plate) ×2 IMPLANT
PROTECTOR NERVE ULNAR (MISCELLANEOUS) ×3 IMPLANT
SCREW CANC LAG 4X40 (Screw) ×4 IMPLANT
SCREW CORTICAL 3.5MM  12MM (Screw) ×4 IMPLANT
SCREW CORTICAL 3.5MM 12MM (Screw) IMPLANT
SCREW CORTICAL 3.5MM 14MM (Screw) ×2 IMPLANT
SCREW NLOCK CANC HEX 4X16 (Screw) ×4 IMPLANT
SPLINT PLASTER CAST XFAST 5X30 (CAST SUPPLIES) IMPLANT
SPLINT PLASTER XFAST SET 5X30 (CAST SUPPLIES) ×4
STRIP CLOSURE SKIN 1/2X4 (GAUZE/BANDAGES/DRESSINGS) ×1 IMPLANT
SUT ETHILON 2 0 PS N (SUTURE) ×4 IMPLANT
SUT ETHILON 3 0 PS 1 (SUTURE) ×1 IMPLANT
SUT MNCRL AB 4-0 PS2 18 (SUTURE) ×1 IMPLANT
SUT VIC AB 1 CT1 27 (SUTURE) ×6
SUT VIC AB 1 CT1 27XBRD ANTBC (SUTURE) ×2 IMPLANT
SUT VIC AB 1 CT1 36 (SUTURE) ×2 IMPLANT
SUT VIC AB 2-0 CT1 27 (SUTURE) ×3
SUT VIC AB 2-0 CT1 TAPERPNT 27 (SUTURE) ×1 IMPLANT
SUT VIC AB 2-0 CT2 27 (SUTURE) ×2 IMPLANT
TOWEL OR 17X26 10 PK STRL BLUE (TOWEL DISPOSABLE) ×2 IMPLANT
ZIPTIGHT ANKLE SYNODESMOSS FIX (Ankle) ×3 IMPLANT

## 2018-06-09 NOTE — Brief Op Note (Signed)
06/09/2018  9:24 AM  PATIENT:  Debra Russell  53 y.o. female  PRE-OPERATIVE DIAGNOSIS:  Left ankle bimalleolar fracture with syndesmotic injury  POST-OPERATIVE DIAGNOSIS:  Left ankle bimalleolar fracture with syndesmotic injury  PROCEDURE:  Procedure(s) with comments: OPEN REDUCTION INTERNAL FIXATION (ORIF) Left ankle fracture with syndesmotic fixation (Left) -  SURGEON:  Surgeon(s) and Role:    Durene Romans, MD - Primary  PHYSICIAN ASSISTANT: Lanney Gins, PA-C  ANESTHESIA:   regional and general  EBL:  <50cc  BLOOD ADMINISTERED:none  DRAINS: none   LOCAL MEDICATIONS USED:  NONE  SPECIMEN:  No Specimen  DISPOSITION OF SPECIMEN:  N/A  COUNTS:  YES  TOURNIQUET:   Total Tourniquet Time Documented: Thigh (Left) - 49 minutes Total: Thigh (Left) - 49 minutes   DICTATION: .Other Dictation: Dictation Number [id number]  PLAN OF CARE: Admit for overnight observation  PATIENT DISPOSITION:  PACU - hemodynamically stable.   Delay start of Pharmacological VTE agent (>24hrs) due to surgical blood loss or risk of bleeding: no

## 2018-06-09 NOTE — Anesthesia Procedure Notes (Signed)
Anesthesia Regional Block: Popliteal block   Pre-Anesthetic Checklist: ,, timeout performed, Correct Patient, Correct Site, Correct Laterality, Correct Procedure, Correct Position, site marked, Risks and benefits discussed,  Surgical consent,  Pre-op evaluation,  At surgeon's request and post-op pain management  Laterality: Left and Lower  Prep: chloraprep       Needles:  Injection technique: Single-shot     Needle Length: 9cm  Needle Gauge: 22     Additional Needles: Arrow StimuQuik ECHO Echogenic Stimulating PNB Needle  Procedures:,,,, ultrasound used (permanent image in chart),,,,  Narrative:  Start time: 06/09/2018 7:18 AM End time: 06/09/2018 7:30 AM Injection made incrementally with aspirations every 5 mL.  Performed by: Personally  Anesthesiologist: Val Eagle, MD

## 2018-06-09 NOTE — Anesthesia Procedure Notes (Signed)
Procedure Name: Intubation Date/Time: 06/09/2018 7:47 AM Performed by: Silas Sacramento, CRNA Pre-anesthesia Checklist: Patient identified, Emergency Drugs available, Suction available and Patient being monitored Patient Re-evaluated:Patient Re-evaluated prior to induction Oxygen Delivery Method: Circle system utilized Preoxygenation: Pre-oxygenation with 100% oxygen Induction Type: IV induction Ventilation: Mask ventilation without difficulty Laryngoscope Size: Mac and 4 Grade View: Grade I Tube type: Oral Tube size: 7.0 mm Number of attempts: 1 Airway Equipment and Method: Stylet Placement Confirmation: ETT inserted through vocal cords under direct vision,  positive ETCO2 and breath sounds checked- equal and bilateral Secured at: 22 cm Tube secured with: Tape Dental Injury: Teeth and Oropharynx as per pre-operative assessment

## 2018-06-09 NOTE — Anesthesia Preprocedure Evaluation (Addendum)
Anesthesia Evaluation  Patient identified by MRN, date of birth, ID band Patient awake    Reviewed: Allergy & Precautions, NPO status , Patient's Chart, lab work & pertinent test results  History of Anesthesia Complications Negative for: history of anesthetic complications  Airway Mallampati: II  TM Distance: >3 FB Neck ROM: Full    Dental  (+) Teeth Intact   Pulmonary neg pulmonary ROS,    breath sounds clear to auscultation       Cardiovascular negative cardio ROS   Rhythm:Regular     Neuro/Psych PSYCHIATRIC DISORDERS Anxiety negative neurological ROS     GI/Hepatic negative GI ROS,   Endo/Other  negative endocrine ROS  Renal/GU      Musculoskeletal Left ankle bimalleolar fracture with syndesmotic injury   Abdominal   Peds  Hematology negative hematology ROS (+)   Anesthesia Other Findings   Reproductive/Obstetrics                             Anesthesia Physical Anesthesia Plan  ASA: II  Anesthesia Plan: General and Regional   Post-op Pain Management:  Regional for Post-op pain   Induction: Intravenous and Rapid sequence  PONV Risk Score and Plan: 3 and Ondansetron and Dexamethasone  Airway Management Planned: Oral ETT  Additional Equipment: None  Intra-op Plan:   Post-operative Plan: Extubation in OR  Informed Consent: I have reviewed the patients History and Physical, chart, labs and discussed the procedure including the risks, benefits and alternatives for the proposed anesthesia with the patient or authorized representative who has indicated his/her understanding and acceptance.     Dental advisory given  Plan Discussed with: CRNA and Surgeon  Anesthesia Plan Comments:         Anesthesia Quick Evaluation

## 2018-06-09 NOTE — Anesthesia Procedure Notes (Signed)
Anesthesia Regional Block: Adductor canal block   Pre-Anesthetic Checklist: ,, timeout performed, Correct Patient, Correct Site, Correct Laterality, Correct Procedure, Correct Position, site marked, Risks and benefits discussed,  Surgical consent,  Pre-op evaluation,  At surgeon's request and post-op pain management  Laterality: Left  Prep: chloraprep       Needles:  Injection technique: Single-shot     Needle Length: 9cm  Needle Gauge: 22     Additional Needles: Arrow StimuQuik ECHO Echogenic Stimulating PNB Needle  Procedures:,,,, ultrasound used (permanent image in chart),,,,  Narrative:  Start time: 06/09/2018 7:18 AM End time: 06/09/2018 7:30 AM Injection made incrementally with aspirations every 5 mL.  Performed by: Personally  Anesthesiologist: Val Eagle, MD
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
## 2018-06-09 NOTE — Transfer of Care (Signed)
Immediate Anesthesia Transfer of Care Note  Patient: Debra Russell  Procedure(s) Performed: OPEN REDUCTION INTERNAL FIXATION (ORIF) Left ankle fracture with syndesmotic fixation (Left )  Patient Location: PACU  Anesthesia Type:General  Level of Consciousness: awake, oriented and patient cooperative  Airway & Oxygen Therapy: Patient Spontanous Breathing and Patient connected to face mask oxygen  Post-op Assessment: Report given to RN and Post -op Vital signs reviewed and stable  Post vital signs: Reviewed and stable  Last Vitals:  Vitals Value Taken Time  BP 139/85 06/09/2018 10:00 AM  Temp    Pulse 91 06/09/2018 10:01 AM  Resp 15 06/09/2018 10:01 AM  SpO2 99 % 06/09/2018 10:01 AM  Vitals shown include unvalidated device data.  Last Pain: There were no vitals filed for this visit.       Complications: No apparent anesthesia complications

## 2018-06-09 NOTE — Interval H&P Note (Signed)
History and Physical Interval Note:  06/09/2018 7:37 AM  Debra Russell  has presented today for surgery, with the diagnosis of Left ankle bimalleolar fracture with syndesmotic injury.  The various methods of treatment have been discussed with the patient and family. After consideration of risks, benefits and other options for treatment, the patient has consented to  Procedure(s) with comments: OPEN REDUCTION INTERNAL FIXATION (ORIF) Left ankle fracture with syndesmotic fixation (Left) - as a surgical intervention.  The patient's history has been reviewed, patient examined, no change in status, stable for surgery.  I have reviewed the patient's chart and labs.  Questions were answered to the patient's satisfaction.     Shelda Pal

## 2018-06-10 DIAGNOSIS — S82842A Displaced bimalleolar fracture of left lower leg, initial encounter for closed fracture: Secondary | ICD-10-CM | POA: Diagnosis not present

## 2018-06-10 LAB — CBC
HCT: 36.8 % (ref 36.0–46.0)
Hemoglobin: 11.3 g/dL — ABNORMAL LOW (ref 12.0–15.0)
MCH: 29.4 pg (ref 26.0–34.0)
MCHC: 30.7 g/dL (ref 30.0–36.0)
MCV: 95.8 fL (ref 80.0–100.0)
Platelets: 335 10*3/uL (ref 150–400)
RBC: 3.84 MIL/uL — ABNORMAL LOW (ref 3.87–5.11)
RDW: 12.7 % (ref 11.5–15.5)
WBC: 17.4 10*3/uL — ABNORMAL HIGH (ref 4.0–10.5)
nRBC: 0 % (ref 0.0–0.2)

## 2018-06-10 LAB — BASIC METABOLIC PANEL
Anion gap: 6 (ref 5–15)
BUN: 11 mg/dL (ref 6–20)
CO2: 22 mmol/L (ref 22–32)
Calcium: 8.8 mg/dL — ABNORMAL LOW (ref 8.9–10.3)
Chloride: 108 mmol/L (ref 98–111)
Creatinine, Ser: 0.65 mg/dL (ref 0.44–1.00)
GFR calc Af Amer: >60 mL/min (ref >60)
GFR calc non Af Amer: >60 mL/min (ref >60)
Glucose, Bld: 126 mg/dL — ABNORMAL HIGH (ref 70–99)
Potassium: 3.9 mmol/L (ref 3.5–5.1)
Sodium: 136 mmol/L (ref 135–145)

## 2018-06-10 MED ORDER — METHOCARBAMOL 500 MG PO TABS: 500.0000 mg | ORAL_TABLET | Freq: Four times a day (QID) | ORAL | Status: AC | PRN

## 2018-06-10 MED ORDER — ASPIRIN 81 MG PO CHEW: 81.0000 mg | CHEWABLE_TABLET | Freq: Two times a day (BID) | ORAL | Status: AC

## 2018-06-10 MED ORDER — POLYETHYLENE GLYCOL 3350 17 G PO PACK: 17.0000 g | PACK | Freq: Two times a day (BID) | ORAL | 0 refills | Status: AC

## 2018-06-10 MED ORDER — OXYCODONE HCL 5 MG PO TABS: 5.0000 mg | ORAL_TABLET | ORAL | 0 refills | Status: AC | PRN

## 2018-06-10 MED ORDER — LIP MEDEX EX OINT
TOPICAL_OINTMENT | CUTANEOUS | Status: AC
  Administered 2018-06-10: 10:00:00
  Filled 2018-06-10: qty 7

## 2018-06-10 MED ORDER — DOCUSATE SODIUM 100 MG PO CAPS: 100.0000 mg | ORAL_CAPSULE | Freq: Two times a day (BID) | ORAL | 0 refills | Status: AC

## 2018-06-10 NOTE — TOC Initial Note (Signed)
Transition of Care [facility]) - Initial/Assessment Note    Patient Details  Name: Debra Russell MRN: [id number] Date of Birth: Oct 01, 1965  Transition of Care [facility]) CM/SW Contact:    Coralyn Helling, LCSW Phone Number: 06/10/2018, 10:20 AM  Clinical Narrative:                Expected Discharge Plan: Home/Self Care Barriers to Discharge: Continued Medical Work up   Patient Goals and CMS Choice Patient states their goals for this hospitalization and ongoing recovery are:: return home   Choice offered to / list presented to : NA  Expected Discharge Plan and Services Expected Discharge Plan: Home/Self Care   Discharge Planning Services: NA Post Acute Care Choice: Durable Medical Equipment Living arrangements for the past 2 months: Single Family Home Expected Discharge Date: 06/10/18                 DME Agency: [employer] HH Arranged: NA HH Agency: NA  Prior Living Arrangements/Services Living arrangements for the past 2 months: Single Family Home Lives with:: Self Patient language and need for interpreter reviewed:: No Do you feel safe going back to the place where you live?: Yes      Need for Family Participation in Patient Care: No (Comment) Care giver support system in place?: Yes (comment)   Criminal Activity/Legal Involvement Pertinent to Current Situation/Hospitalization: No - Comment as needed  Activities of Daily Living Home Assistive Devices/Equipment: Eyeglasses ADL Screening (condition at time of admission) Patient's cognitive ability adequate to safely complete daily activities?: Yes Is the patient deaf or have difficulty hearing?: No Does the patient have difficulty seeing, even when wearing glasses/contacts?: No Does the patient have difficulty concentrating, remembering, or making decisions?: No Patient able to express need for assistance with ADLs?: Yes Does the patient have difficulty dressing or bathing?: No Independently performs ADLs?: Yes  (appropriate for developmental age) Does the patient have difficulty walking or climbing stairs?: No Weakness of Legs: Left Weakness of Arms/Hands: None  Permission Sought/Granted   Permission granted to share information with : No              Emotional Assessment Appearance:: Appears stated age Attitude/Demeanor/Rapport: Unable to Assess Affect (typically observed): Calm Orientation: : Oriented to Self, Oriented to Place, Oriented to  Time, Oriented to Situation Alcohol / Substance Use: Not Applicable Psych Involvement: No (comment)  Admission diagnosis:  Left ankle bimalleolar fracture with syndesmotic injury Patient Active Problem List   Diagnosis Date Noted  . Ankle fracture, bimalleolar, closed 06/09/2018   PCP:  Nelwyn Salisbury, MD Pharmacy:   [facility] [phone] - [place], [place] - [address] AT [facility] [address] [place] [address] Phone: [phone] Fax: [phone]     Social Determinants of Health (SDOH) Interventions    Readmission Risk Interventions Readmission Risk Prevention Plan 06/10/2018  Post Dischage Appt Complete  Medication Screening Complete  Transportation Screening Complete  Some recent data might be hidden

## 2018-06-10 NOTE — Progress Notes (Signed)
     Subjective: 1 Day Post-Op Procedure(s) (LRB): OPEN REDUCTION INTERNAL FIXATION (ORIF) Left ankle fracture with syndesmotic fixation (Left)   Patient reports pain as mild/  States that she has feeling in her foot, but not in her toes.  She is having some tingling at times, like they are waking up.  Discussed NWB status as well as elevation to help with any swelling. Described the use of ice, even if she can't fully feel it through the splint.  She will d/c home today and follow up in the clinic in 2 weeks.     Objective:   VITALS:   Vitals:   06/10/18 0208 06/10/18 0625  BP: 113/64 125/78  Pulse: 75 71  Resp: 18 18  Temp: 98.5 F (36.9 C) 98.2 F (36.8 C)  SpO2: 98% 98%   No significant swelling noted Toes numb with occasional tingling, but appropriate feeling in the foot Neurovascular intact Compartment soft  LABS Recent Labs    06/10/18 0503  HGB 11.3*  HCT 36.8  WBC 17.4*  PLT 335    Recent Labs    06/10/18 0503  NA 136  K 3.9  BUN 11  CREATININE 0.65  GLUCOSE 126*     Assessment/Plan: 1 Day Post-Op Procedure(s) (LRB): OPEN REDUCTION INTERNAL FIXATION (ORIF) Left ankle fracture with syndesmotic fixation (Left) Advance diet Up with therapy for ambulation safety, NWB D/C IV fluids Discharge home Follow up in 2 weeks at [facility]). Follow up with OLIN,Camree Wigington D in 2 weeks.  Contact information:  [facility]) [address], [address] [place] [address] [phone]        Anastasio Auerbach. Lekeisha Arenas   PAC  06/10/2018, 7:40 AM

## 2018-06-10 NOTE — Evaluation (Signed)
Physical Therapy Evaluation Patient Details Name: Debra Russell MRN: [id number] DOB: 03/03/1965 Today's Date: 06/10/2018   History of Present Illness  ORIF left bimalleolar fracture  Clinical Impression  The patient ambulating safely with RW. Patient reports using crutches at home but has access to RW. Will demonsrtate knee walker  For possible use in near future.Pt admitted with above diagnosis. Pt currently with functional limitations due to the deficits listed below (see PT Problem List).  Pt will benefit from skilled PT to increase their independence and safety with mobility to allow discharge to the venue listed below.       Follow Up Recommendations No PT follow up    Equipment Recommendations  (will demonstrate knee walker for possible use later)    Recommendations for Other Services       Precautions / Restrictions Precautions Precautions: Fall Restrictions LLE Weight Bearing: Non weight bearing      Mobility  Bed Mobility Overal bed mobility: Independent                Transfers Overall transfer level: Needs assistance Equipment used: Rolling walker (2 wheeled) Transfers: Sit to/from Stand Sit to Stand: Supervision            Ambulation/Gait Ambulation/Gait assistance: Supervision Gait Distance (Feet): 40 Feet Assistive device: Rolling walker (2 wheeled) Gait Pattern/deviations: Step-to pattern     General Gait Details: gait is steady  Information systems managertairs            Wheelchair Mobility    Modified Rankin (Stroke Patients Only)       Balance Overall balance assessment: Modified Independent                                           Pertinent Vitals/Pain Pain Assessment: 0-10 Pain Score: 2  Pain Location: left ankle Pain Descriptors / Indicators: Aching;Tingling Pain Intervention(s): Premedicated before session;Ice applied;Repositioned    Home Living Family/patient expects to be discharged to:: Private  residence Living Arrangements: Children Available Help at Discharge: Family Type of Home: House Home Access: Stairs to enter   Secretary/administratorntrance Stairs-Number of Steps: 1 Home Layout: One level Home Equipment: Environmental consultantWalker - 2 wheels;Crutches      Prior Function Level of Independence: Independent with assistive device(s)               Hand Dominance        Extremity/Trunk Assessment   Upper Extremity Assessment Upper Extremity Assessment: Overall WFL for tasks assessed    Lower Extremity Assessment Lower Extremity Assessment: LLE deficits/detail LLE Deficits / Details: toeas to ankle are numb with no sensation and no toe movement, RN and MD aware  LLE Sensation: decreased light touch LLE Coordination: decreased fine motor;decreased gross motor       Communication   Communication: No difficulties  Cognition Arousal/Alertness: Awake/alert Behavior During Therapy: WFL for tasks assessed/performed Overall Cognitive Status: Within Functional Limits for tasks assessed                                        General Comments General comments (skin integrity, edema, etc.): with RW    Exercises     Assessment/Plan    PT Assessment Patient needs continued PT services  PT Problem List Decreased activity tolerance;Decreased balance;Decreased mobility;Impaired sensation  PT Treatment Interventions DME instruction;Gait training;Functional mobility training;Therapeutic activities;Patient/family education    PT Goals (Current goals can be found in the Care Plan section)  Acute Rehab PT Goals Patient Stated Goal: go home PT Goal Formulation: With patient Time For Goal Achievement: 06/11/18 Potential to Achieve Goals: Good    Frequency Min 5X/week   Barriers to discharge        Co-evaluation               AM-PAC PT "6 Clicks" Mobility  Outcome Measure Help needed turning from your back to your side while in a flat bed without using bedrails?:  None Help needed moving from lying on your back to sitting on the side of a flat bed without using bedrails?: None Help needed moving to and from a bed to a chair (including a wheelchair)?: None Help needed standing up from a chair using your arms (e.g., wheelchair or bedside chair)?: A Little Help needed to walk in hospital room?: A Little Help needed climbing 3-5 steps with a railing? : A Lot 6 Click Score: 20    End of Session   Activity Tolerance: Patient tolerated treatment well Patient left: in chair;with call bell/phone within reach Nurse Communication: Mobility status PT Visit Diagnosis: Unsteadiness on feet (R26.81)    Time: 9702-6378 PT Time Calculation (min) (ACUTE ONLY): 39 min   Charges:   PT Evaluation $PT Eval Low Complexity: 1 Low PT Treatments $Gait Training: 8-22 mins        Blanchard Kelch PT Acute Rehabilitation Services Pager [phone] Office [phone]    Rada Hay 06/10/2018, 9:38 AM

## 2018-06-10 NOTE — TOC Transition Note (Signed)
Transition of Care [facility]) - CM/SW Discharge Note   Patient Details  Name: Debra Russell MRN: [id number] Date of Birth: 04-Apr-1965  Transition of Care [facility]) CM/SW Contact:  Coralyn Helling, LCSW Phone Number: 06/10/2018, 10:23 AM   Clinical Narrative:   DME ordered    Final next level of care: Home/Self Care Barriers to Discharge: Continued Medical Work up   Patient Goals and CMS Choice Patient states their goals for this hospitalization and ongoing recovery are:: return home   Choice offered to / list presented to : NA  Discharge Placement                       Discharge Plan and Services   Discharge Planning Services: NA Post Acute Care Choice: Durable Medical Equipment            DME Agency: [employer] HH Arranged: NA HH Agency: NA   Social Determinants of Health (SDOH) Interventions     Readmission Risk Interventions Readmission Risk Prevention Plan 06/10/2018  Post Dischage Appt Complete  Medication Screening Complete  Transportation Screening Complete  Some recent data might be hidden

## 2018-06-10 NOTE — Plan of Care (Signed)
  Problem: Education: Goal: Knowledge of General Education information will improve Description Including pain rating scale, medication(s)/side effects and non-pharmacologic comfort measures Outcome: Adequate for Discharge   Problem: Health Behavior/Discharge Planning: Goal: Ability to manage health-related needs will improve Outcome: Adequate for Discharge   Problem: Clinical Measurements: Goal: Ability to maintain clinical measurements within normal limits will improve Outcome: Adequate for Discharge Goal: Will remain free from infection Outcome: Adequate for Discharge Goal: Diagnostic test results will improve Outcome: Adequate for Discharge Goal: Respiratory complications will improve Outcome: Adequate for Discharge Goal: Cardiovascular complication will be avoided Outcome: Adequate for Discharge   Problem: Activity: Goal: Risk for activity intolerance will decrease Outcome: Adequate for Discharge   Problem: Nutrition: Goal: Adequate nutrition will be maintained Outcome: Adequate for Discharge   Problem: Coping: Goal: Level of anxiety will decrease Outcome: Adequate for Discharge   Problem: Elimination: Goal: Will not experience complications related to bowel motility Outcome: Adequate for Discharge Goal: Will not experience complications related to urinary retention Outcome: Adequate for Discharge   Problem: Pain Managment: Goal: General experience of comfort will improve Outcome: Adequate for Discharge   Problem: Safety: Goal: Ability to remain free from injury will improve Outcome: Adequate for Discharge   Problem: Skin Integrity: Goal: Risk for impaired skin integrity will decrease Outcome: Adequate for Discharge  Discharge teaching complete. Written information given.

## 2018-06-10 NOTE — Progress Notes (Signed)
Physical Therapy Treatment Patient Details Name: Debra Russell Oneal-Smith MRN: [id number] DOB: 05/28/1965 Today's Date: 06/10/2018    History of Present Illness ORIF left bimalleolar fracture    PT Comments    Patient  Is not ready/stable on knee walker at this time. Ready for DC using RW or crutches.  Follow Up Recommendations  No PT follow up     Equipment Recommendations  None recommended by PT    Recommendations for Other Services       Precautions / Restrictions Precautions Precautions: Fall Splint/Cast - Date Prophylactic Dressing Applied (if applicable): 06/09/18 Restrictions Weight Bearing Restrictions: Yes LLE Weight Bearing: Non weight bearing    Mobility  Bed Mobility Overal bed mobility: Independent             General bed mobility comments: oob  Transfers Overall transfer level: Needs assistance Equipment used: Rolling walker (2 wheeled) Transfers: Sit to/from Stand Sit to Stand: Supervision;Min assist         General transfer comment: attempted to stand and place knee walker, patient did not feel steady so switched to RW.  Ambulation/Gait Ambulation/Gait assistance: Supervision Gait Distance (Feet): 40 Feet Assistive device: Rolling walker (2 wheeled) Gait Pattern/deviations: Step-to pattern     General Gait Details: gait is steady   Social research officer, governmenttairs             Wheelchair Mobility    Modified Rankin (Stroke Patients Only)       Balance Overall balance assessment: Modified Independent                                          Cognition Arousal/Alertness: Awake/alert Behavior During Therapy: WFL for tasks assessed/performed Overall Cognitive Status: Within Functional Limits for tasks assessed                                        Exercises      General Comments General comments (skin integrity, edema, etc.): with RW      Pertinent Vitals/Pain Pain Assessment: 0-10 Pain Score: 2  Pain  Location: left ankle Pain Descriptors / Indicators: Aching;Tingling Pain Intervention(s): Monitored during session;Ice applied    Home Living Family/patient expects to be discharged to:: Private residence Living Arrangements: Children Available Help at Discharge: Family Type of Home: House Home Access: Stairs to enter   Home Layout: One level Home Equipment: Environmental consultantWalker - 2 wheels;Crutches      Prior Function Level of Independence: Independent with assistive device(s)          PT Goals (current goals can now be found in the care plan section) Acute Rehab PT Goals Patient Stated Goal: go home PT Goal Formulation: With patient Time For Goal Achievement: 06/11/18 Potential to Achieve Goals: Good Progress towards PT goals: Progressing toward goals    Frequency    Min 5X/week      PT Plan Current plan remains appropriate    Co-evaluation              AM-PAC PT "6 Clicks" Mobility   Outcome Measure  Help needed turning from your back to your side while in a flat bed without using bedrails?: None Help needed moving from lying on your back to sitting on the side of a flat bed without using bedrails?: None Help needed moving to  and from a bed to a chair (including a wheelchair)?: None Help needed standing up from a chair using your arms (e.g., wheelchair or bedside chair)?: A Little Help needed to walk in hospital room?: A Little Help needed climbing 3-5 steps with a railing? : A Little 6 Click Score: 21    End of Session   Activity Tolerance: Patient tolerated treatment well Patient left: in chair;with call bell/phone within reach Nurse Communication: Mobility status PT Visit Diagnosis: Unsteadiness on feet (R26.81)     Time: 5462-7035 PT Time Calculation (min) (ACUTE ONLY): 15 min  Charges:  $Gait Training: 8-22 mins                     Blanchard Kelch PT Acute Rehabilitation Services Pager [phone] Office [phone]    Rada Hay 06/10/2018, 12:33 PM
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
## 2018-06-11 ENCOUNTER — Encounter (HOSPITAL_COMMUNITY): Payer: Self-pay | Admitting: Orthopedic Surgery

## 2018-06-13 NOTE — Anesthesia Postprocedure Evaluation (Signed)
Anesthesia Post Note  Patient: Debra Russell  Procedure(s) Performed: OPEN REDUCTION INTERNAL FIXATION (ORIF) Left ankle fracture with syndesmotic fixation (Left )     Patient location during evaluation: PACU Anesthesia Type: Regional and General Level of consciousness: awake and alert Pain management: pain level controlled Vital Signs Assessment: post-procedure vital signs reviewed and stable Respiratory status: spontaneous breathing, nonlabored ventilation, respiratory function stable and patient connected to nasal cannula oxygen Cardiovascular status: blood pressure returned to baseline and stable Postop Assessment: no apparent nausea or vomiting Anesthetic complications: no    Last Vitals:  Vitals:   06/10/18 0625 06/10/18 1013  BP: 125/78 118/75  Pulse: 71 78  Resp: 18 16  Temp: 36.8 C 37 C  SpO2: 98% 97%    Last Pain:  Vitals:   06/10/18 1013  TempSrc: Oral  PainSc:                  [name]

## 2019-08-17 ENCOUNTER — Encounter: Payer: Self-pay | Admitting: *Deleted

## 2019-08-17 ENCOUNTER — Ambulatory Visit
Admission: RE | Admit: 2019-08-17 | Discharge: 2019-08-17 | Disposition: A | Discharge status: Home / Self Care | Payer: Self-pay | Source: Ambulatory Visit | Attending: Oncology | Admitting: Oncology

## 2019-08-17 ENCOUNTER — Ambulatory Visit: Payer: Self-pay | Attending: Oncology | Admitting: *Deleted

## 2019-08-17 ENCOUNTER — Other Ambulatory Visit: Payer: Self-pay

## 2019-08-17 VITALS — BP 127/80 | HR 74 | Temp 97.5°F | Resp 20 | Wt 196.1 lb

## 2019-08-17 DIAGNOSIS — N63 Unspecified lump in unspecified breast: Secondary | ICD-10-CM

## 2019-08-17 NOTE — Patient Instructions (Signed)
Gave patient hand-out, Women Staying Healthy, Active and Well from [facility], with education on breast health, pap smears, heart and colon health. 

## 2019-08-17 NOTE — Progress Notes (Signed)
  Subjective:     Patient ID: Debra Russell, female   DOB: March 13, 1965, 54 y.o.   MRN: [id number]  HPI   Review of Systems     Objective:   Physical Exam Chest:     Breasts:        Right: Mass present. No swelling, bleeding, inverted nipple, nipple discharge, skin change or tenderness.        Left: No swelling, bleeding, inverted nipple, mass, nipple discharge, skin change or tenderness.    Lymphadenopathy:     Upper Body:     Right upper body: Axillary adenopathy present. No supraclavicular adenopathy.     Left upper body: No supraclavicular or axillary adenopathy.        Assessment:     54 year old White female presents to [facility] with complaints of a right breast mass.  Patient states she found the area of concern about 2-3 weeks ago.  Denies any tenderness.  On clinical breast exam I can palpate an approximate 0.5 cm smooth, mobile nodule at 5:00 right breast at the edge of the areola.  There is also firm scattered fibroglandular like tissue at bilateral inner breast at the 3 and 9:00 areas.  Taught self breast awareness.  Last pap was in [place] in 12/20.  No pap per protocol.  Patient has been screened for eligibility.  She does not have any insurance, Medicare or Medicaid.  She also meets financial eligibility.   Risk Assessment    Risk Scores      08/17/2019   Last edited by: Neita Garnet, CMA   5-year risk: 1.2 %   Lifetime risk: 9.6 %            Plan:     Will get bilateral diagnostic mammogram and ultrasound.  Will follow up per [facility] protocol.

## 2019-08-23 ENCOUNTER — Encounter: Payer: Self-pay | Admitting: *Deleted

## 2019-08-23 DIAGNOSIS — N63 Unspecified lump in unspecified breast: Secondary | ICD-10-CM

## 2019-08-23 NOTE — Progress Notes (Signed)
Spoke to patient today and reveiwed her mammogram and ultrasound results.  She is aware of bilateral breast cyst and need to return in 3 months for her next mammogram and ultrasound.  Appointment to be mailed and viewed in Mychart.

## 2019-11-18 ENCOUNTER — Ambulatory Visit
Admission: RE | Admit: 2019-11-18 | Discharge: 2019-11-18 | Disposition: A | Discharge status: Home / Self Care | Payer: Self-pay | Source: Ambulatory Visit | Attending: Oncology | Admitting: Oncology

## 2019-11-18 ENCOUNTER — Other Ambulatory Visit: Payer: Self-pay

## 2019-11-18 DIAGNOSIS — N63 Unspecified lump in unspecified breast: Secondary | ICD-10-CM | POA: Insufficient documentation

## 2019-11-29 ENCOUNTER — Other Ambulatory Visit: Payer: Self-pay | Admitting: *Deleted

## 2019-11-29 DIAGNOSIS — N63 Unspecified lump in unspecified breast: Secondary | ICD-10-CM

## 2019-12-01 ENCOUNTER — Encounter: Payer: Self-pay | Admitting: *Deleted

## 2019-12-01 NOTE — Progress Notes (Signed)
Letter mailed to inform patient of her next mammogram on 02/21/20 @ 10:20

## 2020-02-21 ENCOUNTER — Ambulatory Visit
Admission: RE | Admit: 2020-02-21 | Discharge: 2020-02-21 | Disposition: A | Discharge status: Home / Self Care | Payer: Self-pay | Source: Ambulatory Visit | Attending: Oncology | Admitting: Oncology

## 2020-02-21 ENCOUNTER — Other Ambulatory Visit: Payer: Self-pay | Admitting: *Deleted

## 2020-02-21 ENCOUNTER — Other Ambulatory Visit: Payer: Self-pay

## 2020-02-21 DIAGNOSIS — N63 Unspecified lump in unspecified breast: Secondary | ICD-10-CM

## 2021-02-23 IMAGING — MG MM DIGITAL DIAGNOSTIC UNILAT*R* W/ TOMO W/ CAD
6 series · 6 of 18 positions shown · non-contrast
Comparison: Previous exam(s).

ACR Breast Density Category a: The breast tissue is almost entirely
fatty.

CLINICAL DATA: 54-year-old female for follow-up of probable RIGHT
breast fat necrosis.

EXAM:
DIGITAL DIAGNOSTIC UNILATERAL RIGHT MAMMOGRAM WITH TOMO AND CAD

[R CC synth-2D (1 of 2)]
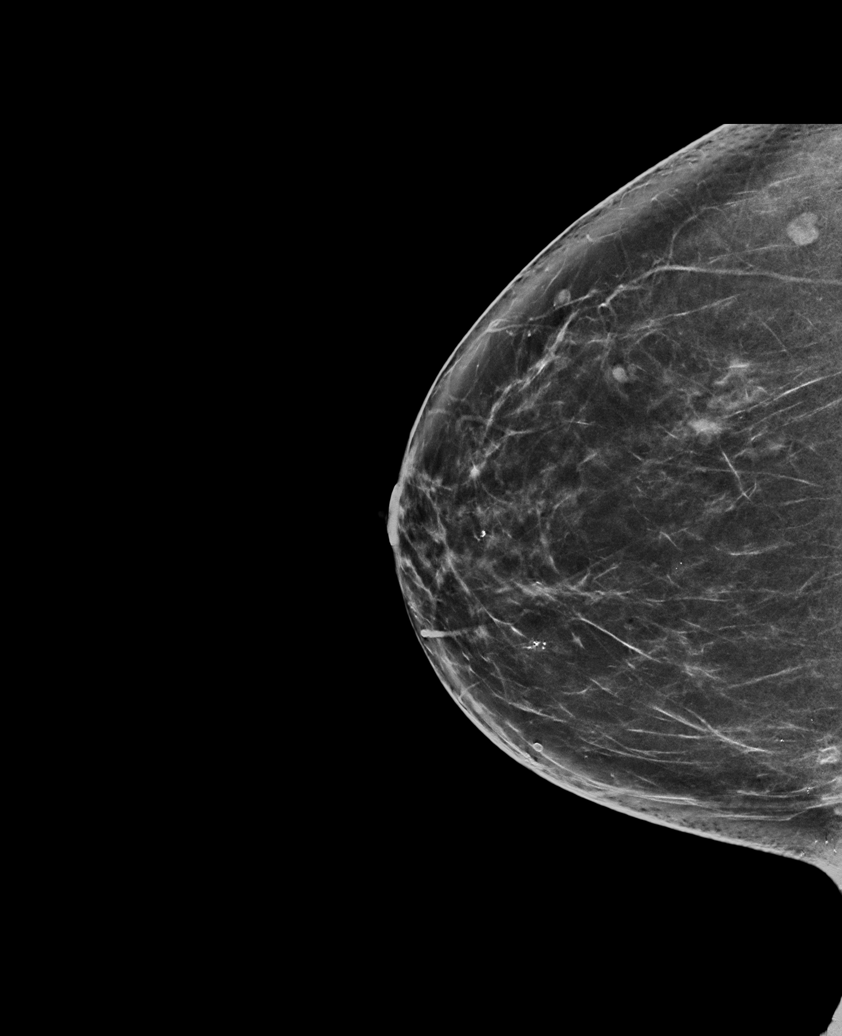

[R MLO synth-2D]
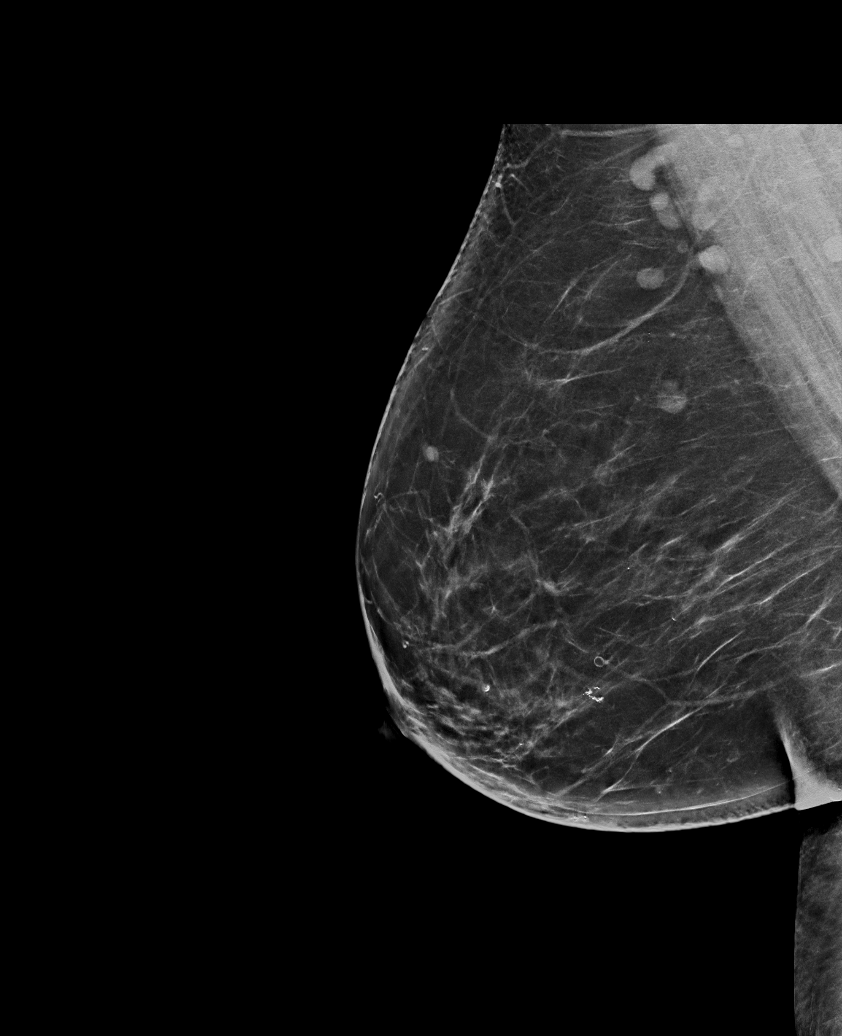

[R CC synth-2D (2 of 2)]
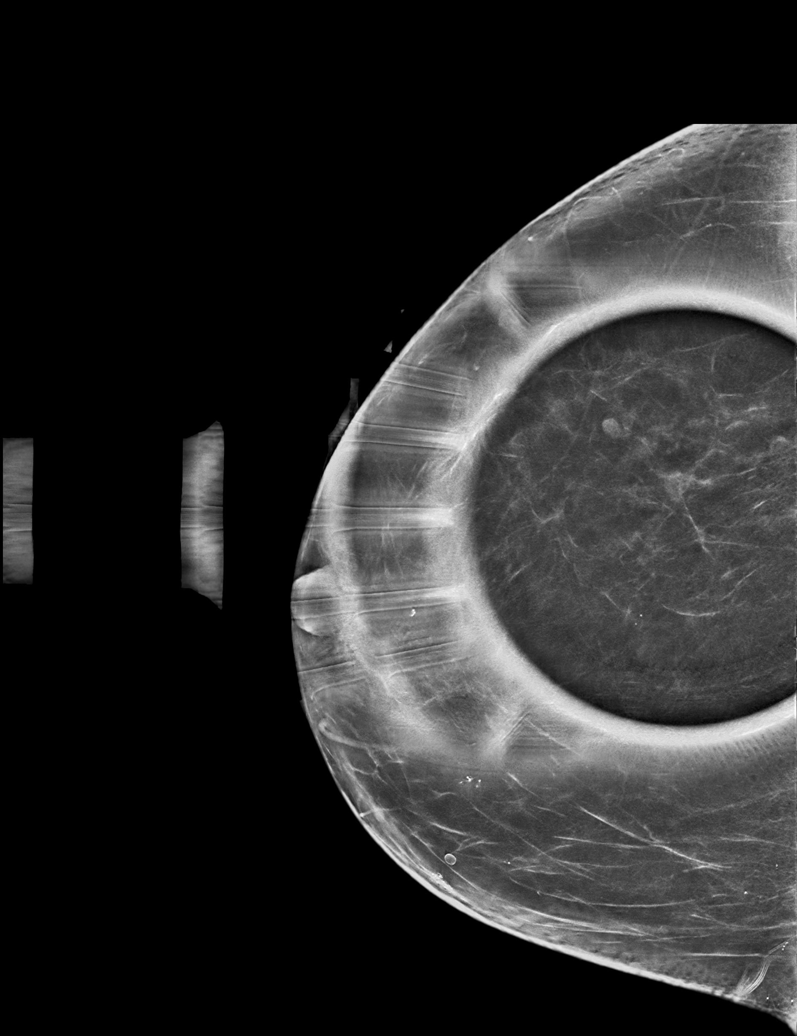

[R CC tomo (1 of 2) · tomo slice 37/72.0]
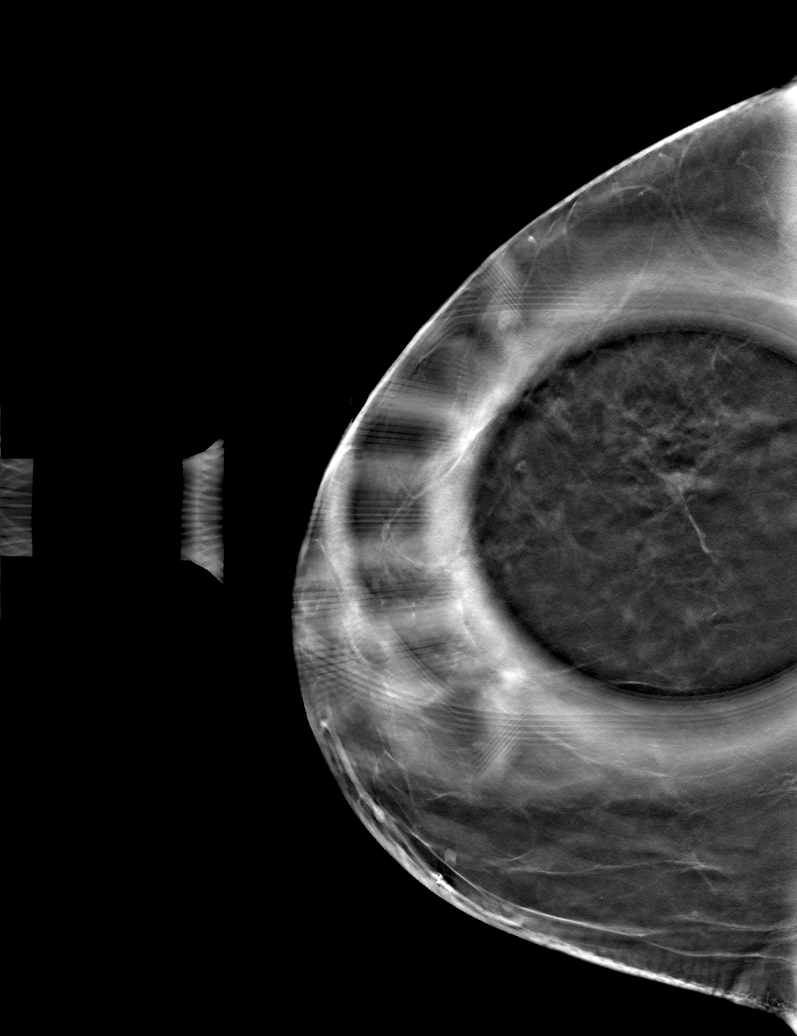

[R CC tomo (2 of 2) · tomo slice 39/78.0]
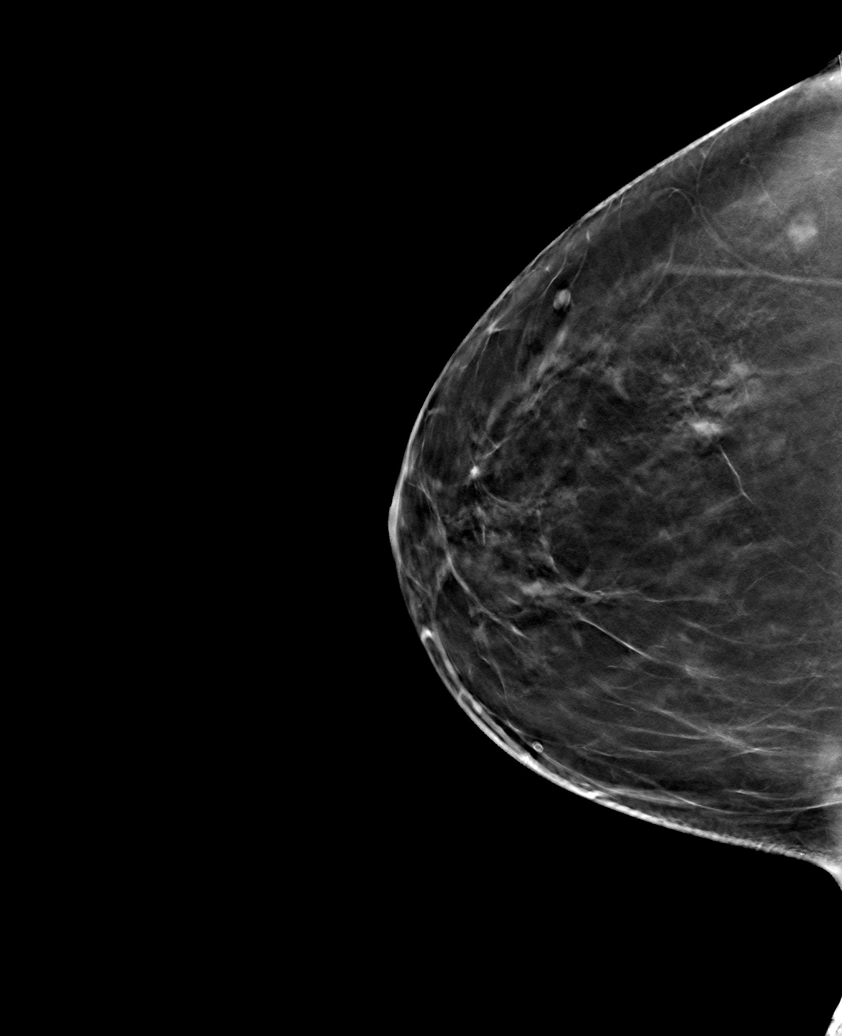

[R MLO tomo · tomo slice 42/83.0]
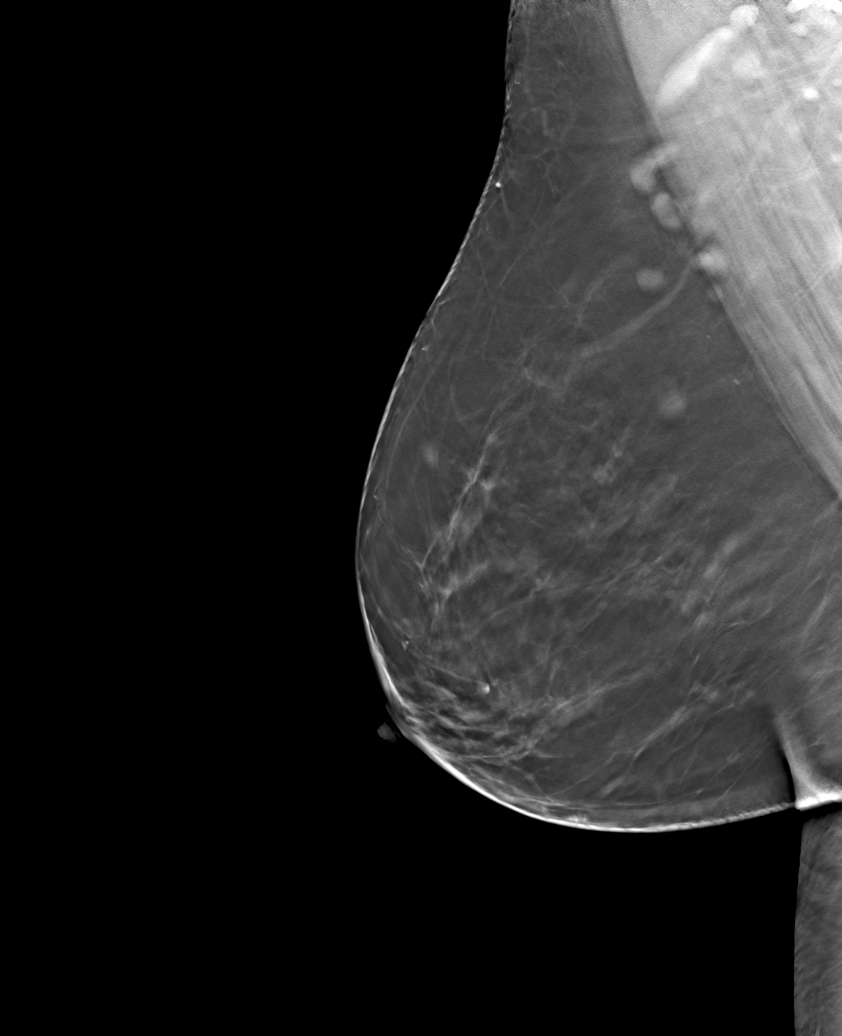

[6 of 18 positions shown; findings below may reference images not displayed]

FINDINGS: 2D/3D full field and spot compression views of the RIGHT breast
demonstrate increasing dystrophic calcifications within the INNER
RIGHT breast, compatible with fat necrosis.

No suspicious RIGHT breast findings are noted.

Mammographic images were processed with CAD.
IMPRESSION: RIGHT breast fat necrosis changes. No further imaging follow-up
recommended.

RECOMMENDATION:
Bilateral screening mammogram in 6 months to resume annual mammogram
schedule.

I have discussed the findings and recommendations with the patient.
If applicable, a reminder letter will be sent to the patient
regarding the next appointment.

BI-RADS CATEGORY  2: Benign.

## 2021-07-04 ENCOUNTER — Ambulatory Visit (HOSPITAL_COMMUNITY)
Admission: EM | Admit: 2021-07-04 | Discharge: 2021-07-04 | Disposition: A | Discharge status: Home / Self Care | Payer: 59

## 2021-07-04 ENCOUNTER — Encounter (HOSPITAL_COMMUNITY): Payer: Self-pay

## 2021-07-04 DIAGNOSIS — J0100 Acute maxillary sinusitis, unspecified: Secondary | ICD-10-CM

## 2021-07-04 MED ORDER — BENZONATATE 100 MG PO CAPS: 100.0000 mg | ORAL_CAPSULE | Freq: Three times a day (TID) | ORAL | 0 refills | Status: AC

## 2021-07-04 MED ORDER — DOXYCYCLINE HYCLATE 100 MG PO CAPS: 100.0000 mg | ORAL_CAPSULE | Freq: Two times a day (BID) | ORAL | 0 refills | Status: AC

## 2021-07-04 NOTE — Discharge Instructions (Addendum)
-   We are treating your sinus infection with doxycycline.  Please take this twice daily for 7 days. ?-You can use Tessalon Perles 100 mg every 8 hours as needed for cough.  Use this with caution as it may cause drowsiness or sleepiness. ?-Please start plain Mucinex or guaifenesin to help with the congestion; you can also drink lots of fluids which will help with congestion naturally. ?- Continue the Claritin and you can also start on Flonase with will help with the ear popping and post nasal drainage  ?- Saline sinus rinses can help with nasal congestion; pick up Ocean nasal spray OTC at the pharmacy ?

## 2021-07-04 NOTE — ED Provider Notes (Signed)
?[facility] ? ? ? ?CSN: [id number] ?Arrival date & time: 07/04/21  2694 ? ? ?  ? ?History   ?Chief Complaint ?Chief Complaint  ?Patient presents with  ? Otalgia  ?  bilat  ? ? ?HPI ?Debra Russell is a 56 y.o. female.  ? ?Patient presents with over 2 weeks of cough, congestion, sinus pressure, and feeling like there is fluid in her ears.  She also reports mild sore throat that is coming on for the past couple of weeks.  She is not feeling better, so came in to be seen.  She denies current fever, chest pain, shortness of breath, or chest tightness.  She thinks she wheezes at nighttime.  She denies abdominal pain, nausea/vomiting, new rash.  She has been more tired than normal.  She has been taking over-the-counter cough and cold medications as well as Claritin without relief of symptoms. ? ? ?Past Medical History:  ?Diagnosis Date  ? Anxiety   ? ? ?Patient Active Problem List  ? Diagnosis Date Noted  ? Ankle fracture, bimalleolar, closed 06/09/2018  ? ? ?Past Surgical History:  ?Procedure Laterality Date  ? CESAREAN SECTION    ? ORIF ANKLE FRACTURE Left 06/09/2018  ? Procedure: OPEN REDUCTION INTERNAL FIXATION (ORIF) Left ankle fracture with syndesmotic fixation;  Surgeon: Durene Romans, MD;  Location: [facility];  Service: Orthopedics;  Laterality: Left;   ? ? ?OB History   ?No obstetric history on file. ?  ? ? ? ?Home Medications   ? ?Prior to Admission medications   ?Medication Sig Start Date End Date Taking? Authorizing Provider  ?benzonatate (TESSALON) 100 MG capsule Take 1 capsule (100 mg total) by mouth every 8 (eight) hours. Do not take while driving or operating heavy machinery 07/04/21  Yes Valentino Nose, NP  ?doxycycline (VIBRAMYCIN) 100 MG capsule Take 1 capsule (100 mg total) by mouth 2 (two) times daily. 07/04/21  Yes Valentino Nose, NP  ?Loratadine (CLARITIN PO) Take by mouth.   Yes [provider]  ?aspirin 81 MG chewable tablet Chew 1 tablet (81 mg total) by mouth  2 (two) times daily. ?Patient not taking: Reported on 07/04/2021 06/10/18   Lanney Gins, PA-C  ?docusate sodium (COLACE) 100 MG capsule Take 1 capsule (100 mg total) by mouth 2 (two) times daily. ?Patient not taking: Reported on 07/04/2021 06/10/18   Lanney Gins, PA-C  ?methocarbamol (ROBAXIN) 500 MG tablet Take 1 tablet (500 mg total) by mouth every 6 (six) hours as needed for muscle spasms. ?Patient not taking: Reported on 07/04/2021 06/10/18   Lanney Gins, PA-C  ?oxyCODONE (OXY IR/ROXICODONE) 5 MG immediate release tablet Take 1-2 tablets (5-10 mg total) by mouth every 4 (four) hours as needed for moderate pain (pain score 1-5). ?Patient not taking: Reported on 07/04/2021 06/10/18   Lanney Gins, PA-C  ?polyethylene glycol (MIRALAX / GLYCOLAX) 17 g packet Take 17 g by mouth 2 (two) times daily. ?Patient not taking: Reported on 07/04/2021 06/10/18   Lanney Gins, PA-C  ? ? ?Family History ?Family History  ?Problem Relation Age of Onset  ? Breast cancer Neg Hx   ? ? ?Social History ?Social History  ? ?Tobacco Use  ? Smoking status: Never  ? Smokeless tobacco: Never  ?Vaping Use  ? Vaping Use: Never used  ?Substance Use Topics  ? Alcohol use: No  ? Drug use: Never  ? ? ? ?Allergies   ?Penicillins ? ? ?Review of Systems ?Review of Systems ?Per HPI ? ?  Physical Exam ?Triage Vital Signs ?ED Triage Vitals  ?Enc Vitals Group  ?   BP 07/04/21 0901 (!) 140/92  ?   Pulse Rate 07/04/21 0901 79  ?   Resp 07/04/21 0901 14  ?   Temp 07/04/21 0901 98.3 ?F (36.8 ?C)  ?   Temp Source 07/04/21 0901 Oral  ?   SpO2 07/04/21 0901 95 %  ?   Weight --   ?   Height --   ?   Head Circumference --   ?   Peak Flow --   ?   Pain Score 07/04/21 0903 0  ?   Pain Loc --   ?   Pain Edu? --   ?   Excl. in GC? --   ? ?No data found. ? ?Updated Vital Signs ?BP (!) 140/92 (BP Location: Right Arm)   Pulse 79   Temp 98.3 ?F (36.8 ?C) (Oral)   Resp 14   SpO2 95%  ? ?Visual Acuity ?Right Eye Distance:   ?Left Eye Distance:   ?Bilateral  Distance:   ? ?Right Eye Near:   ?Left Eye Near:    ?Bilateral Near:    ? ?Physical Exam ?Vitals and nursing note reviewed.  ?Constitutional:   ?   General: She is not in acute distress. ?   Appearance: Normal appearance. She is not ill-appearing or toxic-appearing.  ?HENT:  ?   Head: Normocephalic and atraumatic.  ?   Right Ear: Tympanic membrane, ear canal and external ear normal.  ?   Left Ear: Tympanic membrane, ear canal and external ear normal.  ?   Nose: Congestion and rhinorrhea present.  ?   Right Sinus: Maxillary sinus tenderness present. No frontal sinus tenderness.  ?   Left Sinus: Maxillary sinus tenderness present. No frontal sinus tenderness.  ?   Mouth/Throat:  ?   Mouth: Mucous membranes are moist.  ?   Pharynx: Oropharynx is clear. Posterior oropharyngeal erythema present. No oropharyngeal exudate.  ?Eyes:  ?   General: No scleral icterus. ?   Extraocular Movements: Extraocular movements intact.  ?Cardiovascular:  ?   Rate and Rhythm: Normal rate and regular rhythm.  ?Pulmonary:  ?   Effort: Pulmonary effort is normal. No respiratory distress.  ?   Breath sounds: Normal breath sounds. No wheezing, rhonchi or rales.  ?Abdominal:  ?   General: Abdomen is flat. Bowel sounds are normal. There is no distension.  ?   Palpations: Abdomen is soft.  ?Musculoskeletal:  ?   Cervical back: Normal range of motion and neck supple.  ?Lymphadenopathy:  ?   Cervical: No cervical adenopathy.  ?Skin: ?   General: Skin is warm and dry.  ?   Capillary Refill: Capillary refill takes less than 2 seconds.  ?   Coloration: Skin is not jaundiced or pale.  ?   Findings: No erythema or rash.  ?Neurological:  ?   Mental Status: She is alert and oriented to person, place, and time.  ?   Motor: No weakness.  ?Psychiatric:     ?   Behavior: Behavior is cooperative.  ? ? ? ?UC Treatments / Results  ?Labs ?(all labs ordered are listed, but only abnormal results are displayed) ?Labs Reviewed - No data to  display ? ?EKG ? ? ?Radiology ?No results found. ? ?Procedures ?Procedures (including critical care time) ? ?Medications Ordered in UC ?Medications - No data to display ? ?Initial Impression / Assessment and Plan / UC Course  ?  I have reviewed the triage vital signs and the nursing notes. ? ?Pertinent labs & imaging results that were available during my care of the patient were reviewed by me and considered in my medical decision making (see chart for details). ? ?  ?Treat maxillary sinusitis with doxycycline 100 mg twice daily for 7 days.  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  Can use Tessalon Perles as well-prescription sent in.  Discussed supportive care including pushing hydration, guaifenesin, nasal saline rinses.  Also discussed use of intranasal corticosteroid to help with fluid behind your ears.  Seek care if symptoms persist or worsen despite treatment.  Note given for work. ?Final Clinical Impressions(s) / UC Diagnoses  ? ?Final diagnoses:  ?Acute non-recurrent maxillary sinusitis  ? ? ? ?Discharge Instructions   ? ?  ?- We are treating your sinus infection with doxycycline.  Please take this twice daily for 7 days. ?-You can use Tessalon Perles 100 mg every 8 hours as needed for cough.  Use this with caution as it may cause drowsiness or sleepiness. ?-Please start plain Mucinex or guaifenesin to help with the congestion; you can also drink lots of fluids which will help with congestion naturally. ?- Continue the Claritin and you can also start on Flonase with will help with the ear popping and post nasal drainage  ?- Saline sinus rinses can help with nasal congestion; pick up Ocean nasal spray OTC at the pharmacy ? ? ? ?ED Prescriptions   ? ? Medication Sig Dispense Auth. Provider  ? doxycycline (VIBRAMYCIN) 100 MG capsule Take 1 capsule (100 mg total) by mouth 2 (two) times daily. 14 capsule Cathlean MarseillesMartinez, Loreena Valeri A, NP  ? benzonatate (TESSALON) 100 MG capsule Take 1 capsule (100 mg  total) by mouth every 8 (eight) hours. Do not take while driving or operating heavy machinery 21 capsule Valentino NoseMartinez, Anwen Cannedy A, NP  ? ?  ? ?PDMP not reviewed this encounter. ?  ?Valentino NoseMartinez, Jaeveon Ashland A, NP ?07/04/21 [phone] ? ?

## 2021-07-04 NOTE — ED Triage Notes (Signed)
Pt presents with cough, bilateral ear popping, pain, and ringing and congestion that began 2 weeks ago ?

## 2021-09-28 ENCOUNTER — Other Ambulatory Visit: Payer: Self-pay | Admitting: Family Medicine

## 2021-10-01 ENCOUNTER — Other Ambulatory Visit: Payer: Self-pay | Admitting: Family Medicine

## 2021-10-01 DIAGNOSIS — Z1231 Encounter for screening mammogram for malignant neoplasm of breast: Secondary | ICD-10-CM

## 2021-10-23 LAB — EXTERNAL GENERIC LAB PROCEDURE: COLOGUARD: NEGATIVE

## 2021-10-23 LAB — COLOGUARD: COLOGUARD: NEGATIVE

## 2022-10-02 ENCOUNTER — Other Ambulatory Visit: Payer: Self-pay | Admitting: Family Medicine

## 2022-10-02 DIAGNOSIS — Z1231 Encounter for screening mammogram for malignant neoplasm of breast: Secondary | ICD-10-CM

## 2022-10-07 ENCOUNTER — Ambulatory Visit
Admission: RE | Admit: 2022-10-07 | Discharge: 2022-10-07 | Disposition: A | Discharge status: Home / Self Care | Payer: 59 | Source: Ambulatory Visit | Attending: Family Medicine | Admitting: Family Medicine

## 2022-10-07 DIAGNOSIS — Z1231 Encounter for screening mammogram for malignant neoplasm of breast: Secondary | ICD-10-CM

## 2023-10-29 ENCOUNTER — Other Ambulatory Visit: Payer: Self-pay | Admitting: Family Medicine

## 2023-10-29 DIAGNOSIS — Z1231 Encounter for screening mammogram for malignant neoplasm of breast: Secondary | ICD-10-CM

## 2023-11-12 ENCOUNTER — Ambulatory Visit
Admission: RE | Admit: 2023-11-12 | Discharge: 2023-11-12 | Disposition: A | Discharge status: Home / Self Care | Source: Ambulatory Visit | Attending: Family Medicine | Admitting: Family Medicine

## 2023-11-12 DIAGNOSIS — Z1231 Encounter for screening mammogram for malignant neoplasm of breast: Secondary | ICD-10-CM
